# Patient Record
Sex: Female | Born: 1980 | Race: White | Hispanic: No | Smoking: Former smoker
Health system: Southern US, Community
[De-identification: ages and names within clinical notes are randomized; demographics above are authoritative.]

## PROBLEM LIST (undated history)

## (undated) DIAGNOSIS — M1990 Unspecified osteoarthritis, unspecified site: Secondary | ICD-10-CM

## (undated) DIAGNOSIS — Z789 Other specified health status: Secondary | ICD-10-CM

---
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
## 2000-01-13 ENCOUNTER — Inpatient Hospital Stay (HOSPITAL_COMMUNITY): Admission: AD | Admit: 2000-01-13 | Discharge: 2000-01-13 | Payer: Self-pay | Admitting: Obstetrics & Gynecology

## 2000-01-13 ENCOUNTER — Inpatient Hospital Stay (HOSPITAL_COMMUNITY): Admission: AD | Admit: 2000-01-13 | Discharge: 2000-01-17 | Payer: Self-pay | Admitting: Obstetrics and Gynecology

## 2000-02-14 ENCOUNTER — Other Ambulatory Visit: Admission: RE | Admit: 2000-02-14 | Discharge: 2000-02-14 | Payer: Self-pay | Admitting: Obstetrics and Gynecology

## 2000-03-06 ENCOUNTER — Emergency Department (HOSPITAL_COMMUNITY): Admission: EM | Admit: 2000-03-06 | Discharge: 2000-03-06 | Payer: Self-pay | Admitting: Emergency Medicine

## 2000-04-21 ENCOUNTER — Emergency Department (HOSPITAL_COMMUNITY): Admission: EM | Admit: 2000-04-21 | Discharge: 2000-04-21 | Payer: Self-pay | Admitting: Emergency Medicine

## 2000-05-12 ENCOUNTER — Encounter (INDEPENDENT_AMBULATORY_CARE_PROVIDER_SITE_OTHER): Payer: Self-pay | Admitting: Specialist

## 2000-05-12 ENCOUNTER — Ambulatory Visit (HOSPITAL_COMMUNITY): Admission: RE | Admit: 2000-05-12 | Discharge: 2000-05-12 | Payer: Self-pay | Admitting: Obstetrics and Gynecology

## 2000-08-26 ENCOUNTER — Other Ambulatory Visit: Admission: RE | Admit: 2000-08-26 | Discharge: 2000-08-26 | Payer: Self-pay | Admitting: Obstetrics and Gynecology

## 2001-04-23 ENCOUNTER — Other Ambulatory Visit: Admission: RE | Admit: 2001-04-23 | Discharge: 2001-04-23 | Payer: Self-pay | Admitting: Obstetrics and Gynecology

## 2001-08-09 ENCOUNTER — Other Ambulatory Visit: Admission: RE | Admit: 2001-08-09 | Discharge: 2001-08-09 | Payer: Self-pay | Admitting: Obstetrics and Gynecology

## 2003-06-14 ENCOUNTER — Emergency Department (HOSPITAL_COMMUNITY): Admission: EM | Admit: 2003-06-14 | Discharge: 2003-06-14 | Payer: Self-pay | Admitting: Emergency Medicine

## 2003-07-05 ENCOUNTER — Emergency Department (HOSPITAL_COMMUNITY): Admission: EM | Admit: 2003-07-05 | Discharge: 2003-07-05 | Payer: Self-pay | Admitting: Emergency Medicine

## 2003-10-03 ENCOUNTER — Other Ambulatory Visit: Admission: RE | Admit: 2003-10-03 | Discharge: 2003-10-03 | Payer: Self-pay | Admitting: Obstetrics and Gynecology

## 2004-03-09 ENCOUNTER — Inpatient Hospital Stay (HOSPITAL_COMMUNITY): Admission: AD | Admit: 2004-03-09 | Discharge: 2004-03-09 | Payer: Self-pay | Admitting: Obstetrics and Gynecology

## 2004-03-15 ENCOUNTER — Encounter (INDEPENDENT_AMBULATORY_CARE_PROVIDER_SITE_OTHER): Payer: Self-pay | Admitting: Specialist

## 2004-03-15 ENCOUNTER — Inpatient Hospital Stay (HOSPITAL_COMMUNITY): Admission: RE | Admit: 2004-03-15 | Discharge: 2004-03-18 | Payer: Self-pay | Admitting: Obstetrics and Gynecology

## 2004-05-26 ENCOUNTER — Emergency Department (HOSPITAL_COMMUNITY): Admission: EM | Admit: 2004-05-26 | Discharge: 2004-05-26 | Payer: Self-pay | Admitting: *Deleted

## 2004-06-01 ENCOUNTER — Emergency Department (HOSPITAL_COMMUNITY): Admission: EM | Admit: 2004-06-01 | Discharge: 2004-06-01 | Payer: Self-pay | Admitting: Emergency Medicine

## 2005-07-23 ENCOUNTER — Emergency Department (HOSPITAL_COMMUNITY): Admission: EM | Admit: 2005-07-23 | Discharge: 2005-07-23 | Payer: Self-pay | Admitting: Emergency Medicine

## 2005-07-25 ENCOUNTER — Emergency Department (HOSPITAL_COMMUNITY): Admission: EM | Admit: 2005-07-25 | Discharge: 2005-07-25 | Payer: Self-pay | Admitting: Family Medicine

## 2006-01-28 ENCOUNTER — Emergency Department (HOSPITAL_COMMUNITY): Admission: EM | Admit: 2006-01-28 | Discharge: 2006-01-28 | Payer: Self-pay | Admitting: Emergency Medicine

## 2006-08-15 ENCOUNTER — Emergency Department: Payer: Self-pay | Admitting: Emergency Medicine

## 2006-09-05 ENCOUNTER — Emergency Department (HOSPITAL_COMMUNITY): Admission: EM | Admit: 2006-09-05 | Discharge: 2006-09-05 | Payer: Self-pay | Admitting: Family Medicine

## 2007-04-08 ENCOUNTER — Ambulatory Visit (HOSPITAL_COMMUNITY): Admission: RE | Admit: 2007-04-08 | Discharge: 2007-04-08 | Payer: Self-pay | Admitting: Obstetrics and Gynecology

## 2007-05-06 ENCOUNTER — Ambulatory Visit (HOSPITAL_COMMUNITY): Admission: RE | Admit: 2007-05-06 | Discharge: 2007-05-06 | Payer: Self-pay | Admitting: Obstetrics and Gynecology

## 2007-07-16 ENCOUNTER — Ambulatory Visit (HOSPITAL_COMMUNITY): Admission: RE | Admit: 2007-07-16 | Discharge: 2007-07-16 | Payer: Self-pay | Admitting: Obstetrics and Gynecology

## 2008-05-15 ENCOUNTER — Emergency Department (HOSPITAL_COMMUNITY): Admission: EM | Admit: 2008-05-15 | Discharge: 2008-05-15 | Payer: Self-pay | Admitting: Family Medicine

## 2009-02-27 ENCOUNTER — Emergency Department (HOSPITAL_COMMUNITY): Admission: EM | Admit: 2009-02-27 | Discharge: 2009-02-27 | Payer: Self-pay | Admitting: Emergency Medicine

## 2009-06-27 ENCOUNTER — Emergency Department (HOSPITAL_COMMUNITY): Admission: EM | Admit: 2009-06-27 | Discharge: 2009-06-27 | Payer: Self-pay | Admitting: Emergency Medicine

## 2010-05-03 ENCOUNTER — Emergency Department (HOSPITAL_COMMUNITY): Admission: EM | Admit: 2010-05-03 | Discharge: 2010-05-03 | Payer: Self-pay | Admitting: Emergency Medicine

## 2010-12-30 LAB — URINALYSIS, ROUTINE W REFLEX MICROSCOPIC
Glucose, UA: NEGATIVE mg/dL
Protein, ur: NEGATIVE mg/dL
Urobilinogen, UA: 1 mg/dL (ref 0.0–1.0)

## 2010-12-30 LAB — URINE MICROSCOPIC-ADD ON

## 2010-12-30 LAB — PREGNANCY, URINE: Preg Test, Ur: NEGATIVE

## 2010-12-30 LAB — WET PREP, GENITAL: Trich, Wet Prep: NONE SEEN

## 2011-02-07 NOTE — Discharge Summary (Signed)
[facility]  Patient:    Tiffany Weaver, Tiffany Weaver Visit Number: [id number] MRN: [id number]          Service Type: OBS Location: 910A 9103 [id number] Attending Physician:  Melony Overly Dictated by:   Leilani Able, P.A. Admit Date:  01/13/2000 Discharge Date: 01/17/2000                             Discharge Summary  FINAL DIAGNOSES:              1. Intrauterine pregnancy at term.                               2. Arrest of second stage of labor.  PROCEDURE:                    Primary low transverse cesarean section.  SURGEON:                      Miguel Aschoff, M.D.  COMPLICATIONS:                None.  HISTORY OF PRESENT ILLNESS:   This 30 year old, gravida 1, para 0, was admitted at 41+ weeks in early labor.  HOSPITAL COURSE:              The patient progressed to complete and complete after Pitocin augmentation.  The patient had excellent pushng efforts and an adequate  labor pattern, but was unable to cause the descent of the fetal presenting part to effect vaginal delivery.  The head appeared to be occipitoposterior and at this  point, it was discussed with the patient whether to proceed with a cesarean section.  The patient agreed.  The patient was taken to the operating room by Miguel Aschoff, M.D. where a primary low transverse cesarean section was performed with the delivery of an 8 pound 5 ounce female infant with Apgars of 9 and 9.  The delivery went without complications.  The patients postoperative course was benign without significant fever.  She was felt ready for discharge on postoperative day #3, was sent home on a regular diet, told to decrease activities, told to continue prenatal vitamins, and was given Percocet one to two every four hours as needed for pain. She was also given a prescription for Motrin 600 mg one every six hours as needed for pain.  She was told to follow up in the office in four weeks. Dictated by:    Leilani Able, P.A. Attending Physician:  Melony Overly DD:  02/05/00 TD:  02/05/00 Job: [id number]

## 2011-02-07 NOTE — Op Note (Signed)
[facility]  Patient:    Tiffany Weaver, Tiffany Weaver              MRN: [id number] Proc. Date: 01/14/00 Adm. Date:  [id number] Attending:  Conley Simmonds A                           Operative Report  PREOPERATIVE DIAGNOSES:       Intrauterine pregnancy at term; arrest of the second stage of labor.  POSTOPERATIVE DIAGNOSES:      Intrauterine pregnancy at term; arrest of the second stage of labor; delivery of viable female infant, Apgar 8/9, occipitoposterior position.  PROCEDURE:                    Primary low-flap transverse cesarean section.  SURGEON:                      Miguel Aschoff, M.D.  ANESTHESIA:                   Epidural.  COMPLICATIONS:                None.  JUSTIFICATION:                The patient is an 30 year old white female, gravida 1, para 0, at 41+ weeks, admitted earlier in labor.  The patient progressed to complete dilatation after Pitocin augmentation and with excellent pushing efforts and adequate labor, was unable to cause descent of the fetal presenting part to  effect vaginal delivery.  On examination, the head appeared to be occipitoposterior with a large amount of caput and molding and in view of the high station and three hours of arrested second stage of labor, she is being taken now to the operating room to undergo primary cesarean section.  DESCRIPTION OF PROCEDURE:     Patient was taken to the operating room, placed in a supine position, her previously placed epidural anesthetic was injected and an adequate level of anesthesia was achieved without difficulty.  Following this,  Foley catheter was inserted.  A Pfannenstiel incision was made and extended down through the subcutaneous tissue, with bleeding points being clamped and coagulated as they were encountered.  The fascia was then identified and incised transversely. The fascia was separated from the underlying rectus muscles.  Rectus  muscles were identified in the midline.  The peritoneum was then entered, carefully avoiding  underlying structures.  Peritoneal incision was then extended under direct visualization.  Bladder flap was then created and protected with the bladder blade and then an elliptical transverse incision was made into the lower uterine segment. The amniotic cavity was entered; clear fluid was obtained, although previously reported as meconium stained.  The baby was delivered from an ROP position, again clearing the babys head, and again, marked caput and molding were noted.  The nose and mouth were suctioned with DeLee suction and the baby was delivered and handed to the pediatric team in attendance.  Apgar score at one minute was 8 and at five minutes was 9.  Cord bloods were obtained for appropriate studies, then the placenta was delivered.  The uterus was evacuated of any remaining products of conception, then the uterus was closed in layers; the first layer was a running-interlocking suture of #1 Vicryl, followed by an imbricating suture of #1 Vicryl.  There was a small amount of bleeding noted in the midportion of the  incision; this was brought under control by two figure-of-eight sutures of #1 Vicryl.  The bladder flap was then reapproximated using running continuous 2-0 Vicryl suture.  After this was done, lap counts were taken and found to be correct, then the abdomen was closed.  The parietal peritoneum was closed using  running continuous 0 Vicryl suture.  Rectus muscles were reapproximated using a  running continuous 0 Vicryl suture.  The fascia was closed using two sutures of  0 Vicryl, each starting at the lateral fascial angle and meeting in the midline. Subcutaneous tissue and skin were closed using staples.  The estimated blood loss was approximately 600 cc.  Patient tolerated the procedure well and went to the  recovery room in satisfactory condition.  The patient  tolerated the procedure well.DD:  01/14/00 TD:  01/15/00 Job: [id number]

## 2011-02-07 NOTE — Op Note (Signed)
[facility]  Patient:    Tiffany Weaver, Tiffany Weaver              MRN: [id number] Proc. Date: 05/13/99 Adm. Date:  [id number] Attending:  Miguel Aschoff                           Operative Report  PREOPERATIVE DIAGNOSIS:       Cervical intraepithelial neoplasia with                               unsatisfactory colposcopy.  POSTOPERATIVE DIAGNOSIS:      Cervical intraepithelial neoplasia with                               unsatisfactory colposcopy.  OPERATION:                    Cone biopsy and endocervical curettage.  SURGEON:                      Miguel Aschoff, M.D.  ASSISTANT:  ANESTHESIA:                   General anesthesia.  COMPLICATIONS:                None.  INDICATIONS:                  The patient is a 30 year old white female with cervical dysplasia who underwent colposcopy which revealed a lesion extending into the endocervical canal.  The upper extent of this lesion could not be visualized because of this finding.  She presents now to undergo cone biopsy as both diagnostic and therapeutic procedure.  Risks and benefits of this procedure were discussed with the patient and informed consent has been obtained.  DESCRIPTION OF PROCEDURE:     The patient was taken to the operating room and placed in the supine position and general anesthesia was administered without difficulty.  She was then placed in the dorsal lithotomy position, prepped and draped in the usual sterile fashion. The bladder was catheterized.  After this was done, speculum was placed in the vaginal vault. The anterior cervical lip was grasped with a tenaculum.  The figure-of-eight sutures of 0 chromic were placed between the 2 and 4 oclock positions and the 8 and 10 oclock positions on the lateral cervix to include descending branches of the cervical artery. These were tied and then held for traction.  The cervix was then stained with Lugol solution.  There was diffuse uptake  of Lugol solution on the exocervix.  No gross lesions were visualized.  At this point, dilute Pitressin solution was injected into the cervix and 15 cc of this were used with 20 units being placed in 20 cc of normal saline and again 15 cc were injected into the cervix at the 12, 4 and 8 oclock positions.  Then using a knife, a cone of tissue was cut circumferentially beginning at the 3 oclock position and continuing clockwise until the cone was created, then it was dissected to the endocervix and excised in two pieces, an anterior and posterior piece which were sent as separate specimens.  The residual portion of the endocervical canal was then curetted and this was sent also as a separate specimen. Then the bed of the  cone biopsy site was cauterized with electrocautery with excellent hemostasis achieved.  A Gelfoam pack was placed into this area and held in place by tying with previously placed chromic sutures across the cervix to hold in the Gelfoam.  At this point the procedure was completed.  The estimated blood loss was less than 10 cc.  The patient tolerated the procedure well.  PLAN:                         The plan is for the patient to be discharged to home.  She will be seen back in four weeks for follow-up examination. She is to call for pathology report in one week.  She is to call if there are any problems such as fever, pain, or heavy bleedings.  HOME MEDICATIONS:             Doxycycline 100 mg twice a day for three days. and Darvocet-N 100. DD:  05/12/00 TD:  05/13/00 Job: [id number]

## 2011-02-07 NOTE — H&P (Signed)
NAME:  Tiffany Weaver, Tiffany Weaver                 ACCOUNT NO.:  [id number]   MEDICAL RECORD NO.:  [id number]                   PATIENT TYPE:  INP   LOCATION:  NA                                   [facility]  [facility]   PHYSICIAN:  Malachi Pro. Ambrose Mantle, M.D.              DATE OF BIRTH:  [birth year]   DATE OF ADMISSION:  03/15/2004  DATE OF DISCHARGE:                                HISTORY & PHYSICAL   PRESENT ILLNESS:  This is a 30 year old white female, para 1-0-0-1, gravida  2, with last menstrual period June 12, 2003, [facility] March 15, 2004 by dates  and March 21, 2004 by ultrasound, admitted for repeat C-section after  declining a vaginal birth after cesarean.  Blood group and type O-positive,  negative antibody, nonreactive serology, rubella immune, hepatitis B surface  antigen negative, HIV negative, GC and Chlamydia negative, 1-hour Glucola  107, group B strep negative.  Vaginal ultrasound on August 30, 2003:  [place]-  rump length 3.95 cm, 10 weeks 6 days, [facility] March 21, 2004.  Repeat ultrasound  on October 25, 2003:  [facility] March 21, 2004.  Patient had a low transverse  cervical C-section in the past and was compatible for trial of labor but did  not choose to have that.  She had a relatively uncomplicated prenatal  course.  She did have a bronchitis on March 13, 2004 and was placed on  Augmentin.   ALLERGIES:  Her past medical history reveals no known allergies.   PAST SURGICAL HISTORY:  1. She did have a conization in 2001.  2. She also had a C-section in 2001.  3. She had 2 surgeries on her left forearm in 2004 after falling through a     glass door.   PAST MEDICAL HISTORY:  1. She has had a history of kidney infections.  2. History of depression.   SOCIAL HISTORY:  She does smoke about a half a pack a day.   OBSTETRICAL HISTORY:  In April of 2001, she delivered a 8-pound 5-ounce female  infant by C-section after having pushed for 3 hours without being able to  deliver.   FAMILY HISTORY:   Father and mother had depression and bipolar.   PHYSICAL EXAM:  GENERAL:  Well-developed, well-nourished white female in no  acute distress.  VITAL SIGNS:  Blood pressure 128/80, pulse of 80, weight 151-3/4 pounds.  HEENT:  Normal.  HEART:  Heart normal size and sounds.  No murmurs.  LUNGS:  Lungs clear to P&A.  ABDOMEN:  Abdomen soft.  Fundi height 38 cm on March 13, 2004.  Fetal heart  tones normal.  PELVIC:  Cervix dimple-dilated, 70% to 80% effaced, vertex at a minus 2.   ADMITTING IMPRESSION:  Intrauterine pregnancy at 39 weeks, prior cesarean  section, declines vaginal birth after cesarean section.  The patient is  prepared for cesarean section.  She requests and is ready to proceed.  Malachi Pro. Ambrose Mantle, M.D.    TFH/MEDQ  D:  03/14/2004  T:  03/15/2004  Job:  [id number]

## 2011-02-07 NOTE — Discharge Summary (Signed)
NAME:  [name], Tiffany Weaver                 ACCOUNT NO.:  [id number]   MEDICAL RECORD NO.:  [id number]                   PATIENT TYPE:  INP   LOCATION:  9144                                 [facility]   PHYSICIAN:  Malachi Pro. Ambrose Mantle, M.D.              DATE OF BIRTH:  [birth year]   DATE OF ADMISSION:  03/15/2004  DATE OF DISCHARGE:  03/18/2004                                 DISCHARGE SUMMARY   HOSPITAL COURSE:  This is a 30 year old white female admitted for repeat C-  section after declining vaginal birth after cesarean.  Her due date was March 21, 2004.  She underwent a low transverse cervical cesarean section by Dr.  Ambrose Mantle with Dr. Jackelyn Knife assisting under spinal anesthesia.  Delivery of a  female infant 8 pounds 13 ounces with Apgars of 8 at one and 8 at five  minutes.  Postpartum, the patient did extremely well.  Was discharged on he  third postop day.  Her prenatal course is detailed in her Admission History  and Physical.  Initial hemoglobin 12.8, hematocrit 38.1, white count 10,900,  platelet count 260,000.  Follow up hemoglobin 10.7.  Comprehensive metabolic  profile was normal except for a glucose of 115, BUN of 2, total protein of  5.2, albumin of 2.4.  Urinalysis was negative.  The patient was O positive  with a negative antibody screen.  The patient's staples were removed on the  third postop day.  Strips were applied and she was discharged.   FINAL DIAGNOSES:  Intrauterine pregnancy at 39 weeks delivered vertex by a  repeat cesarean section, declined vaginal birth after cesarean.   OPERATION:  Low transverse cervical C-section.   FINAL CONDITION:  Improved.   INSTRUCTIONS:  Include our regular discharge instruction booklet.  Percocet  5/325 mg, dispense #24 tablets, one every 4-6 hours as needed for pain and  the patient is advised to return to the office in 10 days for followup  examination.                                               Malachi Pro. Ambrose Mantle,  M.D.    TFH/MEDQ  D:  03/18/2004  T:  03/18/2004  Job:  [id number]

## 2011-02-07 NOTE — Op Note (Signed)
NAME:  Tiffany Weaver, Tiffany Weaver                 ACCOUNT NO.:  [id number]   MEDICAL RECORD NO.:  [id number]                   PATIENT TYPE:  INP   LOCATION:  9144                                 [facility]   PHYSICIAN:  Malachi Pro. Ambrose Mantle, M.D.              DATE OF BIRTH:  [birth year]   DATE OF PROCEDURE:  03/15/2004  DATE OF DISCHARGE:                                 OPERATIVE REPORT   PREOPERATIVE DIAGNOSES:  1. Intrauterine pregnancy at 39 weeks.  2. Prior cesarean section.  3. Declined vaginal birth after cesarean section.   POSTOPERATIVE DIAGNOSES:  1. Intrauterine pregnancy at 39 weeks.  2. Prior cesarean section.  3. Declined vaginal birth after cesarean section.   OPERATION:  Low transverse cervical cesarean section.   SURGEON:  Malachi Pro. Ambrose Mantle, M.D.   ASSISTANT:  Zenaida Niece, M.D.   ANESTHESIA:  Spinal anesthesia.   DESCRIPTION OF PROCEDURE:  The patient was brought to the operating room and  given a spinal anesthetic by Dr. Jean Rosenthal.  He had to make two entry  locations.  The first location would not permit entry into the spinal canal.  After he gave spinal anesthesia, the patient was placed in the left lateral  tilt position.  Fetal heart tones had been recorded as normal.  The abdomen  was prepped with Betadine solution.  The urethra was prepped.  The Foley  catheter was inserted to straight drain.  The patient was placed with her  legs and body supine in the left lateral tilt position.  The abdomen was  draped as a sterile field.  Anesthesia was confirmed and the right side of  the old incision was utilized but I made the incision through the skin above  the left side of the incision because it was somewhat asymmetrical.  I  carried this incision through the skin, subcutaneous tissue and fascia.  The  fascia was then separated from the rectus muscles superiorly and inferiorly.  The rectus muscle was slightly separated in the midline.  I created a window  into the peritoneal cavity, enlarged the incision, exposed the lower uterine  segment, incised the lower uterine segment peritoneum, pushed the bladder  inferiorly, made an incision into the lower uterine segment myometrium, and  then with my finger, was able to enter the amniotic sac.  Clear fluid was  obtained.  I enlarged the incision by pulling inferiorly and superiorly.  The infant's vertex was directly OP and the cord was presenting right in  front of the face.  I was able to rotate the baby to an OA position and then  deliver the vertex through the incisional opening.  The nose and pharynx was  suctioned with the bulb.  The remainder of the baby was delivered.  Cord was  clamped.  The infant was given to Dr. Judie Petit, who was in attendance.  He is an 8  pound 13 ounce female infant, Apgars of 8 at one  and 8 at five minutes.  The  placenta was removed intact.  The uterus was inspected and found to be free  of debris.  The cervix was dilated with a ring forceps, and then the uterine  incision was closed with two layers of 0 Vicryl, closing the first layer  with a running locked suture, the second layer with an imbricating suture  nonlocking.  Hemostasis appeared adequate except for a few spots that were  Bovied and then one suture of 3-0 Vicryl was used for hemostasis.  Liberal  irrigation confirmed hemostasis.  Both tubes and ovaries and the uterus  appeared normal.  The abdominal wall was then closed by reapproximating the  rectus muscle with 0 Vicryl closing the fascia with two  running sutures of 0 Vicryl, the subcutaneous with a running 3-0 Vicryl and  the staples were used for the skin.  The patient seemed to tolerate the  procedure well.  Estimated blood loss was 1000 mL.  Sponge and needle counts  were correct.  She was returned to the recovery room in satisfactory  condition.                                               Malachi Pro. Ambrose Mantle, M.D.    TFH/MEDQ  D:  03/15/2004  T:   03/15/2004  Job:  [id number]

## 2011-02-19 ENCOUNTER — Other Ambulatory Visit (HOSPITAL_COMMUNITY)
Admission: RE | Admit: 2011-02-19 | Discharge: 2011-02-19 | Disposition: A | Discharge status: Home / Self Care | Payer: BC Managed Care – PPO | Source: Ambulatory Visit | Attending: Obstetrics and Gynecology | Admitting: Obstetrics and Gynecology

## 2011-02-19 ENCOUNTER — Other Ambulatory Visit: Payer: Self-pay

## 2011-02-19 DIAGNOSIS — Z113 Encounter for screening for infections with a predominantly sexual mode of transmission: Secondary | ICD-10-CM | POA: Insufficient documentation

## 2011-02-19 DIAGNOSIS — Z01419 Encounter for gynecological examination (general) (routine) without abnormal findings: Secondary | ICD-10-CM | POA: Insufficient documentation

## 2011-02-19 DIAGNOSIS — N760 Acute vaginitis: Secondary | ICD-10-CM | POA: Insufficient documentation

## 2011-02-20 ENCOUNTER — Other Ambulatory Visit: Payer: Self-pay

## 2011-10-28 ENCOUNTER — Emergency Department (HOSPITAL_COMMUNITY): Payer: BC Managed Care – PPO

## 2011-10-28 ENCOUNTER — Emergency Department (HOSPITAL_COMMUNITY)
Admission: EM | Admit: 2011-10-28 | Discharge: 2011-10-28 | Disposition: A | Discharge status: Home / Self Care | Payer: BC Managed Care – PPO | Attending: Emergency Medicine | Admitting: Emergency Medicine

## 2011-10-28 ENCOUNTER — Encounter (HOSPITAL_COMMUNITY): Payer: Self-pay | Admitting: Emergency Medicine

## 2011-10-28 DIAGNOSIS — S61409A Unspecified open wound of unspecified hand, initial encounter: Secondary | ICD-10-CM | POA: Insufficient documentation

## 2011-10-28 DIAGNOSIS — S60229A Contusion of unspecified hand, initial encounter: Secondary | ICD-10-CM | POA: Insufficient documentation

## 2011-10-28 DIAGNOSIS — S61419A Laceration without foreign body of unspecified hand, initial encounter: Secondary | ICD-10-CM

## 2011-10-28 DIAGNOSIS — F17200 Nicotine dependence, unspecified, uncomplicated: Secondary | ICD-10-CM | POA: Insufficient documentation

## 2011-10-28 DIAGNOSIS — W268XXA Contact with other sharp object(s), not elsewhere classified, initial encounter: Secondary | ICD-10-CM | POA: Insufficient documentation

## 2011-10-28 MED ORDER — TETANUS-DIPHTH-ACELL PERTUSSIS 5-2.5-18.5 LF-MCG/0.5 IM SUSP
0.5000 mL | Freq: Once | INTRAMUSCULAR | Status: AC
  Administered 2011-10-28: 0.5 mL via INTRAMUSCULAR
  Filled 2011-10-28: qty 0.5

## 2011-10-28 MED ORDER — BACITRACIN ZINC 500 UNIT/GM EX OINT
TOPICAL_OINTMENT | CUTANEOUS | Status: AC
  Administered 2011-10-28: 1
  Filled 2011-10-28: qty 0.9

## 2011-10-28 MED ORDER — IBUPROFEN 800 MG PO TABS: 800.0000 mg | ORAL_TABLET | Freq: Three times a day (TID) | ORAL | Status: AC | PRN

## 2011-10-28 NOTE — ED Provider Notes (Addendum)
Medical screening examination/treatment/procedure(s) were conducted as a shared visit with non-physician practitioner(s) and myself.  I personally evaluated the patient during the encounter 31 year old, right-hand dominant female.  Says she was trying to get into to her house through a locked window.  She looked in the window broke and landed on the dorsum of her right hand.  She has a small laceration approximately half a centimeter of the dorsolateral aspect of her hand.  She states that she is not able to extend her long finger.  On examination.  There is the quadrants.  Laceration, with mild ecchymoses over the third MCP joint.  There is moderate tenderness.  She says that she is not able to extend her finger completely.  Because of pain.  However, when it is flexed approximately 90 she has a able to extend her long finger against force.  The laceration is very superficial and small.  I do not think he needs closure.  We will consult with the hand surgeon concerning the possibility of a tendon injury.  We will splint her digit and I believe outpatient.  Followup is adequate.   Nicholes Stairs, MD 10/28/11 2018  I spoke with Dr. Lajoyce Corners. He agreed with plan. He will see her in office.  Nicholes Stairs, MD 10/28/11 2134

## 2011-10-28 NOTE — ED Provider Notes (Signed)
Medical screening examination/treatment/procedure(s) were conducted as a shared visit with non-physician practitioner(s) and myself.  I personally evaluated the patient during the encounter  Nicholes Stairs, MD 10/28/11 [phone]

## 2011-10-28 NOTE — ED Notes (Signed)
Ortho tech here to apply the finger splint

## 2011-10-28 NOTE — ED Provider Notes (Signed)
History     CSN: [id number]  Arrival date & time 10/28/11  1308   First MD Initiated Contact with Patient 10/28/11 1926      Chief Complaint  Patient presents with  . Extremity Laceration    (Consider location/radiation/quality/duration/timing/severity/associated sxs/prior treatment) HPI Comments: The patient reports she was locked out of her house and was attempting to enter through a window when the window broke cutting her right hand.  States she immediately loss the ability to extend her third finger.  States she has two lacerations, one on her dorsal hand and one over the palmar surface at the base of her thumb.  Denies ability to flex fingers, denies decreased sensation, denies other injury.  Does not know when her last tetanus vx was.    The history is provided by the patient.    History reviewed. No pertinent past medical history.  Past Surgical History  Procedure Date  . Cesarean section     Family History  Problem Relation Age of Onset  . Diabetes Mother     History  Substance Use Topics  . Smoking status: Current Everyday Smoker -- 0.5 packs/day for 15 years    Types: Cigarettes  . Smokeless tobacco: Never Used  . Alcohol Use: No    OB History    Grav Para Term Preterm Abortions TAB SAB Ect Mult Living                  Review of Systems  All other systems reviewed and are negative.    Allergies  Macrobid  Home Medications   Current Outpatient Rx  Name Route Sig Dispense Refill  . NAPROXEN SODIUM 220 MG PO TABS Oral Take 220 mg by mouth 2 (two) times daily with a meal.      BP 121/77  Pulse 84  Temp(Src) 98.5 F (36.9 C) (Oral)  Resp 18  SpO2 100%  Physical Exam  Nursing note and vitals reviewed. Constitutional: She is oriented to person, place, and time. She appears well-developed and well-nourished.  HENT:  Head: Normocephalic and atraumatic.  Neck: Neck supple.  Pulmonary/Chest: Effort normal.  Musculoskeletal:       Arms:  Hands: Neurological: She is alert and oriented to person, place, and time.    ED Course  Procedures (including critical care time)  Labs Reviewed - No data to display No results found.  7:50 PM Patient seen and examined, discussed with Dr Weldon Inches who will also see the patient.   9:37 PM Per my discussion with Dr Weldon Inches, plan if for d/c home with hand surgeon follow up.  Do not need to close lacerations.  No abx necessary at this time.  Patient declines work note.    1. Laceration of hand with tendon involvement       MDM  Patient with very small lacerations of hand, no evidence of foreign body by exam or xray.  Possible extensor tendon injury.  Dr Weldon Inches discussed with hand surgeon on call who recommended follow up in the office.  Discussed wound care with patient.  Splint applied in ED.  Patient verbalizes understanding and agrees with plan. Rise Patience, [place] 10/28/11 2139

## 2011-10-28 NOTE — ED Notes (Addendum)
Pt states she tried to jimmy open her bathroom window, window pane broke in the process and cut the back of her right hand. Went to [facility] and was referred here by physician. Pt states PMD feels she may have sliced through a tendon. Pt is unable to move right third digit.

## 2012-02-11 IMAGING — CR DG SHOULDER 2+V*L*
3 series · 3 of 3 positions shown · non-contrast
Comparison: None.

CLINICAL DATA: Pain

LEFT SHOULDER - 2+ VIEW

[w shoulder ap internal left]
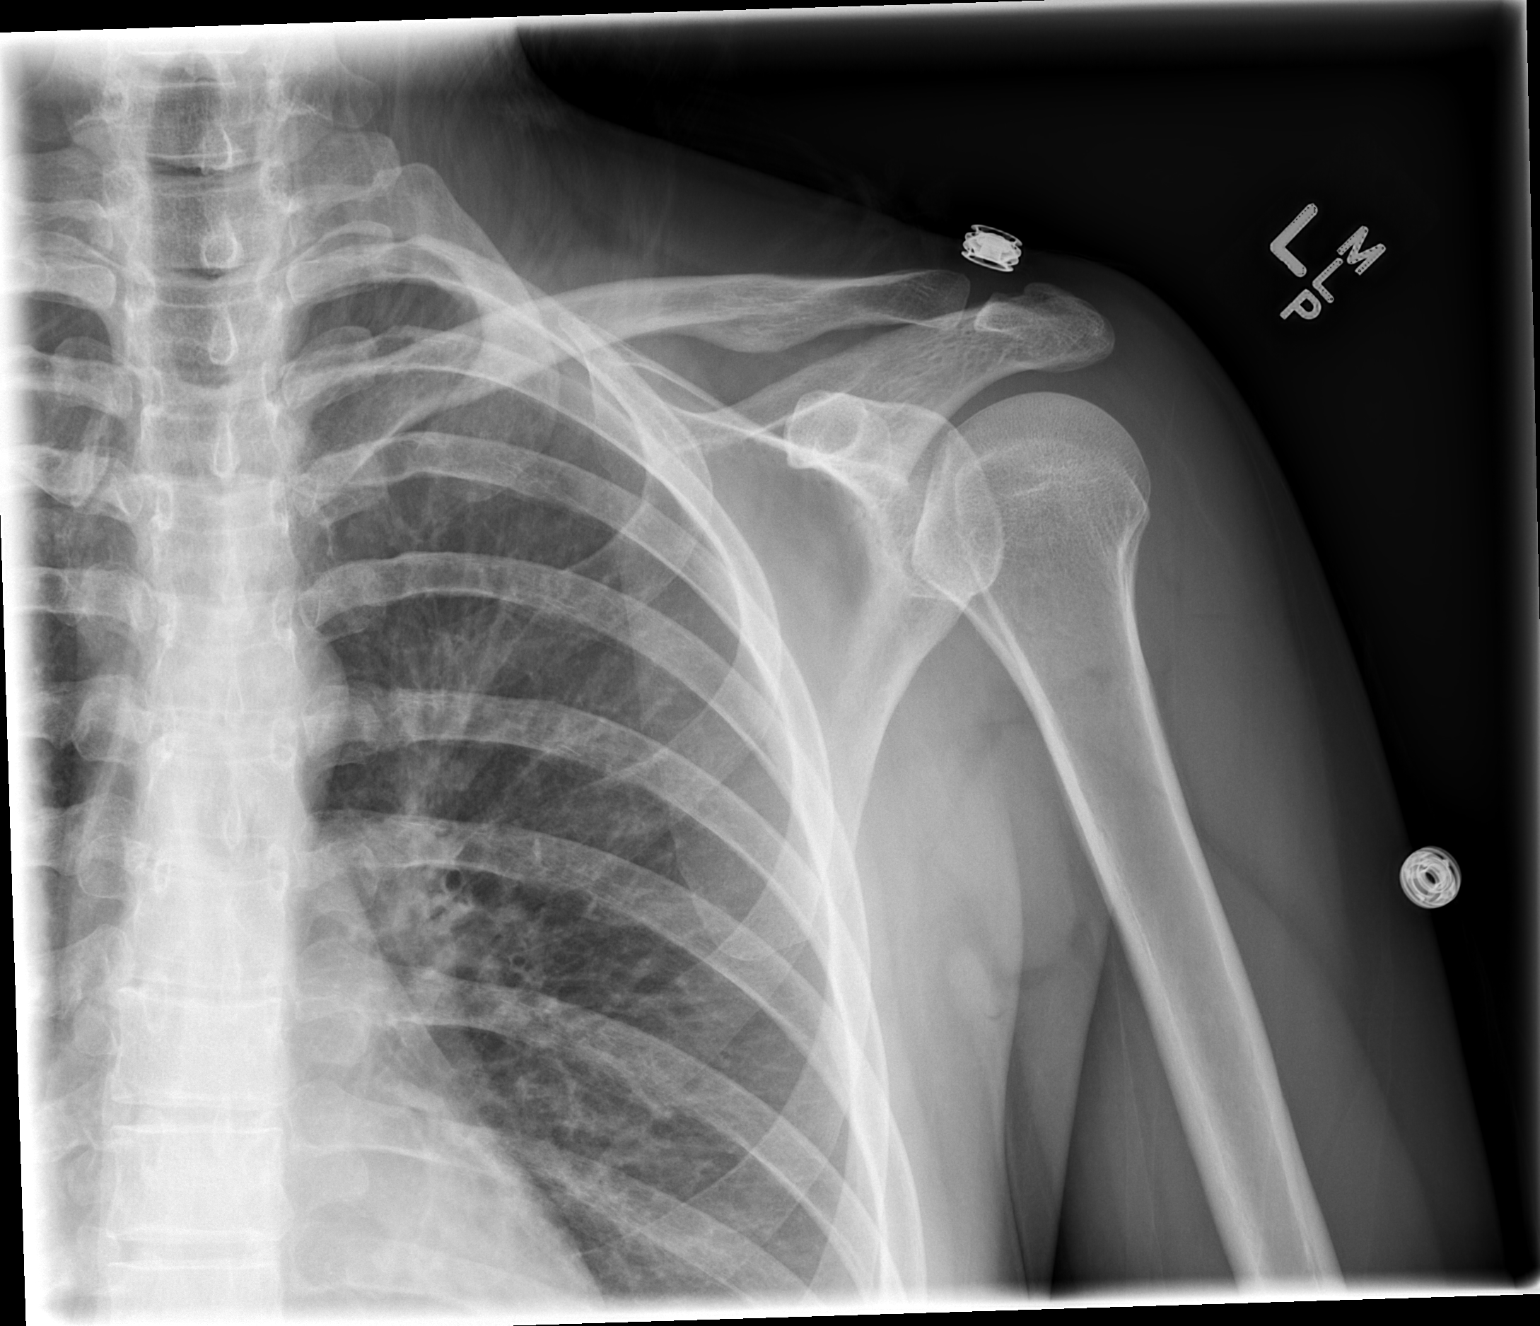

[w shoulder y view left (1 of 2)]
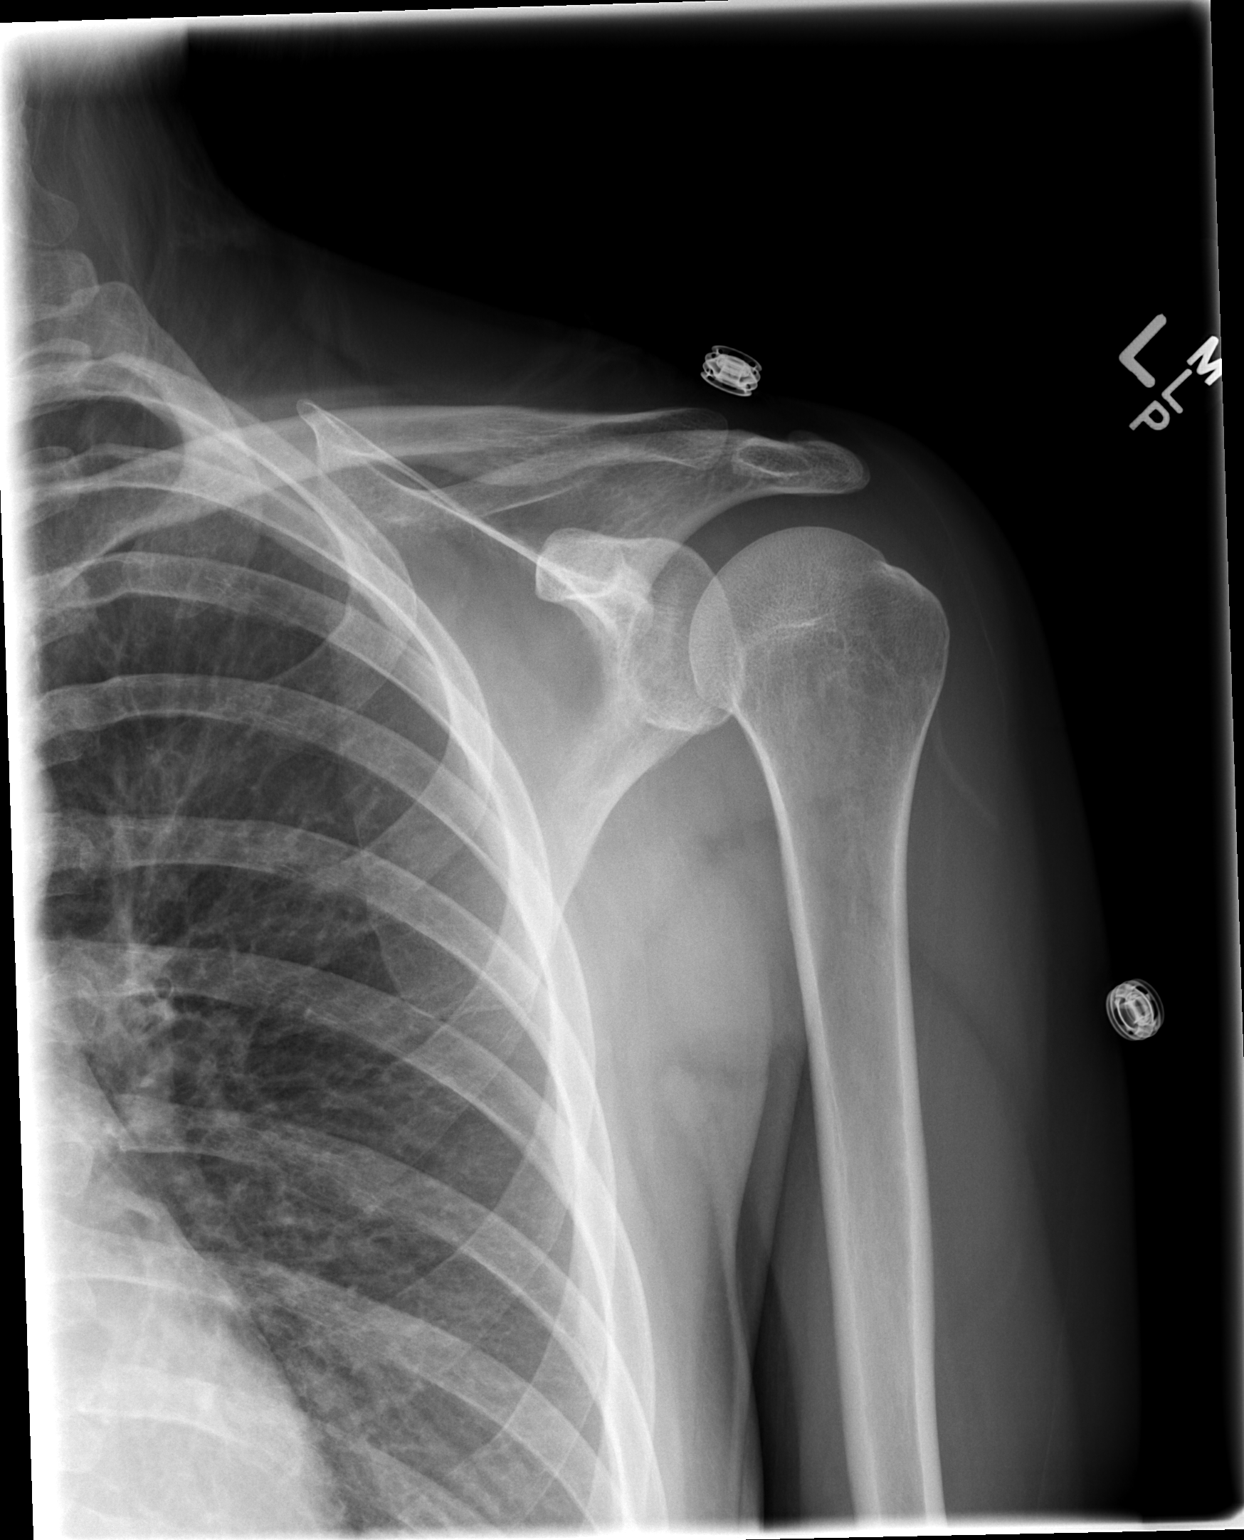

[w shoulder y view left (2 of 2)]
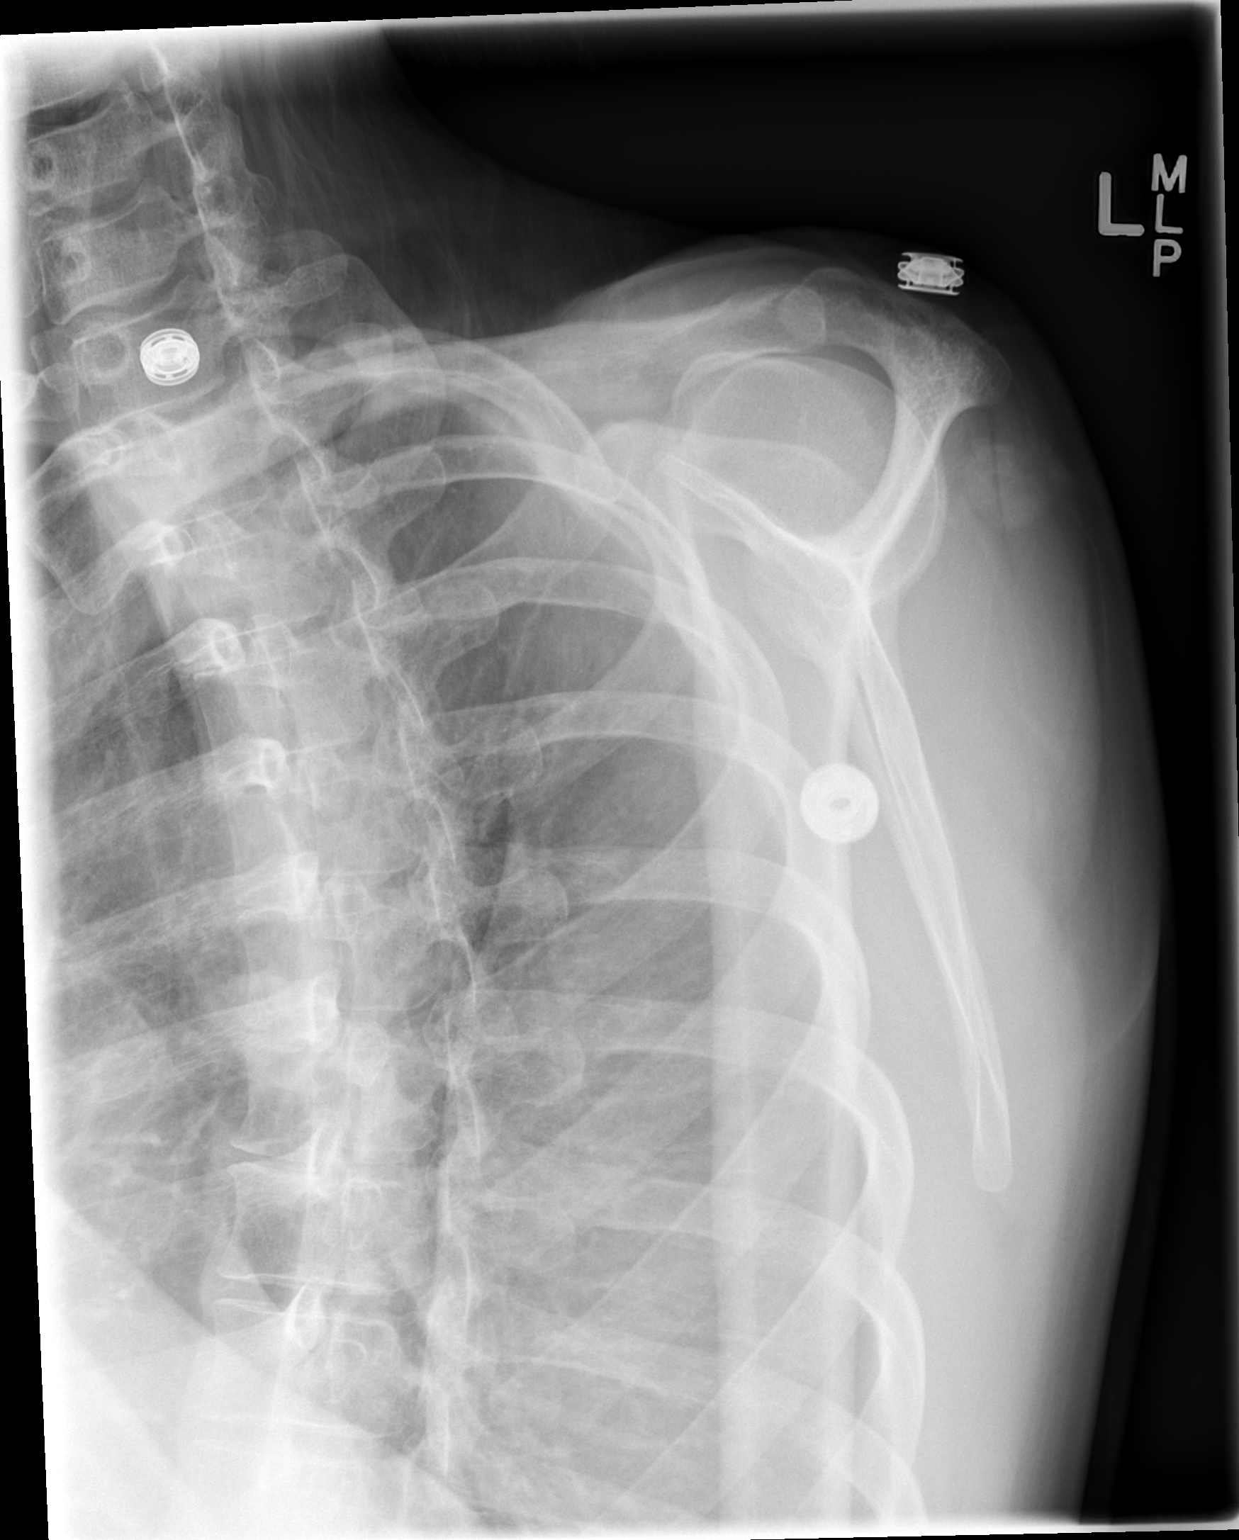

[3 of 3 positions shown; findings below may reference images not displayed]

FINDINGS: Negative for fracture, dislocation, or other acute
abnormality.  Normal alignment and mineralization. No significant
degenerative change.  Regional soft tissues unremarkable.
IMPRESSION: Negative

## 2012-06-15 ENCOUNTER — Other Ambulatory Visit: Payer: Self-pay | Admitting: Obstetrics & Gynecology

## 2012-06-15 ENCOUNTER — Other Ambulatory Visit (HOSPITAL_COMMUNITY)
Admission: RE | Admit: 2012-06-15 | Discharge: 2012-06-15 | Disposition: A | Discharge status: Home / Self Care | Payer: BC Managed Care – PPO | Source: Ambulatory Visit | Attending: Obstetrics & Gynecology | Admitting: Obstetrics & Gynecology

## 2012-06-15 DIAGNOSIS — Z113 Encounter for screening for infections with a predominantly sexual mode of transmission: Secondary | ICD-10-CM | POA: Insufficient documentation

## 2012-06-15 DIAGNOSIS — Z01419 Encounter for gynecological examination (general) (routine) without abnormal findings: Secondary | ICD-10-CM | POA: Insufficient documentation

## 2012-06-15 DIAGNOSIS — Z1151 Encounter for screening for human papillomavirus (HPV): Secondary | ICD-10-CM | POA: Insufficient documentation

## 2012-07-20 ENCOUNTER — Other Ambulatory Visit: Payer: Self-pay | Admitting: Obstetrics and Gynecology

## 2012-12-03 ENCOUNTER — Encounter (HOSPITAL_COMMUNITY): Payer: Self-pay | Admitting: *Deleted

## 2012-12-06 ENCOUNTER — Ambulatory Visit (HOSPITAL_COMMUNITY)
Admission: RE | Admit: 2012-12-06 | Discharge: 2012-12-06 | Disposition: A | Discharge status: Home / Self Care | Payer: BC Managed Care – PPO | Source: Ambulatory Visit | Attending: Obstetrics & Gynecology | Admitting: Obstetrics & Gynecology

## 2012-12-06 ENCOUNTER — Encounter (HOSPITAL_COMMUNITY): Payer: Self-pay | Admitting: Anesthesiology

## 2012-12-06 ENCOUNTER — Encounter (HOSPITAL_COMMUNITY): Payer: Self-pay | Admitting: Pharmacy Technician

## 2012-12-06 ENCOUNTER — Encounter (HOSPITAL_COMMUNITY): Payer: Self-pay | Admitting: *Deleted

## 2012-12-06 ENCOUNTER — Encounter (HOSPITAL_COMMUNITY)
Admission: RE | Disposition: A | Discharge status: Home / Self Care | Payer: Self-pay | Source: Ambulatory Visit | Attending: Obstetrics & Gynecology

## 2012-12-06 ENCOUNTER — Ambulatory Visit (HOSPITAL_COMMUNITY): Payer: BC Managed Care – PPO | Admitting: Anesthesiology

## 2012-12-06 ENCOUNTER — Other Ambulatory Visit: Payer: Self-pay | Admitting: Obstetrics & Gynecology

## 2012-12-06 DIAGNOSIS — Z30433 Encounter for removal and reinsertion of intrauterine contraceptive device: Secondary | ICD-10-CM | POA: Insufficient documentation

## 2012-12-06 HISTORY — PX: HYSTEROSCOPY WITH D & C: SHX1775

## 2012-12-06 HISTORY — DX: Other specified health status: Z78.9

## 2012-12-06 HISTORY — DX: Unspecified osteoarthritis, unspecified site: M19.90

## 2012-12-06 LAB — PREGNANCY, URINE: Preg Test, Ur: NEGATIVE

## 2012-12-06 LAB — CBC
HCT: 39.5 % (ref 36.0–46.0)
MCHC: 33.7 g/dL (ref 30.0–36.0)
Platelets: 185 10*3/uL (ref 150–400)
RDW: 12.2 % (ref 11.5–15.5)

## 2012-12-06 SURGERY — DILATATION AND CURETTAGE /HYSTEROSCOPY
Anesthesia: General | Site: Uterus | Wound class: Clean Contaminated

## 2012-12-06 MED ORDER — LACTATED RINGERS IV SOLN
INTRAVENOUS | Status: DC
  Administered 2012-12-06: 50 mL/h via INTRAVENOUS

## 2012-12-06 MED ORDER — PROPOFOL 10 MG/ML IV EMUL
INTRAVENOUS | Status: DC | PRN
  Administered 2012-12-06: 200 mg via INTRAVENOUS

## 2012-12-06 MED ORDER — FENTANYL CITRATE 0.05 MG/ML IJ SOLN: 25.0000 ug | INTRAMUSCULAR | Status: DC | PRN

## 2012-12-06 MED ORDER — LIDOCAINE HCL (CARDIAC) 20 MG/ML IV SOLN
INTRAVENOUS | Status: AC
  Filled 2012-12-06: qty 5

## 2012-12-06 MED ORDER — FENTANYL CITRATE 0.05 MG/ML IJ SOLN
INTRAMUSCULAR | Status: DC | PRN
  Administered 2012-12-06: 100 ug via INTRAVENOUS

## 2012-12-06 MED ORDER — GLYCINE 1.5 % IR SOLN
Status: DC | PRN
  Administered 2012-12-06: 3000 mL

## 2012-12-06 MED ORDER — MIDAZOLAM HCL 2 MG/2ML IJ SOLN
INTRAMUSCULAR | Status: AC
  Filled 2012-12-06: qty 2

## 2012-12-06 MED ORDER — CEFAZOLIN SODIUM-DEXTROSE 2-3 GM-% IV SOLR
2.0000 g | INTRAVENOUS | Status: AC
  Administered 2012-12-06: 2 g via INTRAVENOUS

## 2012-12-06 MED ORDER — PROPOFOL 10 MG/ML IV EMUL
INTRAVENOUS | Status: AC
  Filled 2012-12-06: qty 20

## 2012-12-06 MED ORDER — KETOROLAC TROMETHAMINE 30 MG/ML IJ SOLN
INTRAMUSCULAR | Status: AC
  Administered 2012-12-06: 30 mg via INTRAVENOUS
  Filled 2012-12-06: qty 1

## 2012-12-06 MED ORDER — ONDANSETRON HCL 4 MG/2ML IJ SOLN
INTRAMUSCULAR | Status: AC
  Filled 2012-12-06: qty 2

## 2012-12-06 MED ORDER — LIDOCAINE HCL (CARDIAC) 20 MG/ML IV SOLN
INTRAVENOUS | Status: DC | PRN
  Administered 2012-12-06: 80 mg via INTRAVENOUS

## 2012-12-06 MED ORDER — MIDAZOLAM HCL 5 MG/5ML IJ SOLN
INTRAMUSCULAR | Status: DC | PRN
  Administered 2012-12-06: 2 mg via INTRAVENOUS

## 2012-12-06 MED ORDER — ONDANSETRON HCL 4 MG/2ML IJ SOLN
INTRAMUSCULAR | Status: DC | PRN
  Administered 2012-12-06: 4 mg via INTRAVENOUS

## 2012-12-06 MED ORDER — FENTANYL CITRATE 0.05 MG/ML IJ SOLN
INTRAMUSCULAR | Status: AC
  Filled 2012-12-06: qty 2

## 2012-12-06 SURGICAL SUPPLY — 12 items
CATH ROBINSON RED A/P 16FR (CATHETERS) ×2 IMPLANT
CLOTH BEACON ORANGE TIMEOUT ST (SAFETY) ×2 IMPLANT
DRESSING TELFA 8X3 (GAUZE/BANDAGES/DRESSINGS) ×2 IMPLANT
GLOVE BIO SURGEON STRL SZ7.5 (GLOVE) ×2 IMPLANT
GLOVE BIOGEL PI IND STRL 8 (GLOVE) ×1 IMPLANT
GLOVE BIOGEL PI INDICATOR 8 (GLOVE) ×1
GOWN STRL REIN XL XLG (GOWN DISPOSABLE) ×4 IMPLANT
LOOP ANGLED CUTTING 22FR (CUTTING LOOP) IMPLANT
PACK HYSTEROSCOPY LF (CUSTOM PROCEDURE TRAY) ×2 IMPLANT
PAD OB MATERNITY 4.3X12.25 (PERSONAL CARE ITEMS) ×2 IMPLANT
TOWEL OR 17X24 6PK STRL BLUE (TOWEL DISPOSABLE) ×4 IMPLANT
WATER STERILE IRR 1000ML POUR (IV SOLUTION) ×2 IMPLANT

## 2012-12-06 NOTE — Transfer of Care (Signed)
Immediate Anesthesia Transfer of Care Note  Patient: Tiffany Weaver  Procedure(s) Performed: Procedure(s) with comments: DILATATION AND CURETTAGE / Removal of IUD-Mirena and insertion of Mirena IUD (N/A) - IUD removal  Patient Location: PACU  Anesthesia Type:General  Level of Consciousness: awake, alert  and oriented  Airway & Oxygen Therapy: Patient Spontanous Breathing and Patient connected to nasal cannula oxygen  Post-op Assessment: Report given to PACU RN and Post -op Vital signs reviewed and stable  Post vital signs: stable  Complications: No apparent anesthesia complications

## 2012-12-06 NOTE — Op Note (Addendum)
12/06/2012  4:35 PM  PATIENT:  Tiffany Weaver  32 y.o. female  PRE-OPERATIVE DIAGNOSIS:  IUD complications  POST-OPERATIVE DIAGNOSIS:  IUD complications  PROCEDURE:  Procedure(s): DILATATION AND CURETTAGE / Removal of IUD-Mirena and insertion of Mirena IUD  SURGEON:  Surgeon(s): Delbert Harness, MD  PHYSICIAN ASSISTANT:   ASSISTANTS: none   ANESTHESIA:   general  EBL:  Total I/O In: 800 [I.V.:800] Out: 25 [Blood:25]  BLOOD ADMINISTERED:none  DRAINS: none   LOCAL MEDICATIONS USED:  NONE  SPECIMEN:  No Specimen  DISPOSITION OF SPECIMEN:  N/A  COUNTS:  YES  TOURNIQUET:  * No tourniquets in log *  DICTATION: .Note written in EPIC  PLAN OF CARE: Discharge to home after PACU  PATIENT DISPOSITION:  PACU - hemodynamically stable.   Delay start of Pharmacological VTE agent (>24hrs) due to surgical blood loss or risk of bleeding:  {YES/NO/NOT APPLICABLE:20182  Indications: The patient had the Mirena IUD for 5 years with that being completed in February 2014.  She presented to the office and Dr. Neva Seat attempted removal in the office.  The patient wanted another IUD of the Mirena type reinserted.  She is now here for removal and insertion in the operating room.  Findings: Patient's cervix easily dilated to a size 15 Pratt dilator.  The uterus sounded to 9 cm.  Uterus was retroverted.  There are no adnexal masses.  The Randall stone forceps were utilized to remove the IUD on first pass.  The Mirena IUD was inserted without difficulty.  Procedure:.  Patient was seen in the preoperative holding area and was identified and matched with procedure.  The patient was then taken to operating room where she was placed  In supine position and general anesthesia was administered without difficulty.  Patient was then prepped and draped in usual sterile fashion.  Patient underwent active time out to match patient with procedure.  The cervix was grasped with single-tooth tenaculum after  exam under anesthesia revealed the above findings.  A single-sided speculum was placed to assist with this.  The cervix was serially dilated to a size 15 Pratt dilator and then the Randall stone forceps were easily administered and with 1 blind pass the IUD was grasped and removed.    The Mirena IUD was inserted in usual fashion.  The strings were cut at 4 cm.  Silver nitrate was applied to 1 of the tenaculum sites.  Good hemostasis was noted.  All issues and removed the vagina.  Patient was reversed from general anesthesia and placed in supine position taken to the recovery room in awake and stable condition.   The lot # and expiration date were noted in the nursing notes

## 2012-12-06 NOTE — Anesthesia Preprocedure Evaluation (Signed)

## 2012-12-06 NOTE — Anesthesia Postprocedure Evaluation (Signed)
  Anesthesia Post-op Note  Patient: Tiffany Weaver  Procedure(s) Performed: Procedure(s) with comments: DILATATION AND CURETTAGE / Removal of IUD-Mirena and insertion of Mirena IUD (N/A) - IUD removal  Patient is awake and responsive. Pain and nausea are reasonably well controlled. Vital signs are stable and clinically acceptable. Oxygen saturation is clinically acceptable. There are no apparent anesthetic complications at this time. Patient is ready for discharge.

## 2012-12-06 NOTE — Interval H&P Note (Signed)
History and Physical Interval Note:  12/06/2012 3:55 PM  Tiffany Weaver  has presented today for surgery, with the diagnosis of IUD complications  The various methods of treatment have been discussed with the patient and family. After consideration of risks, benefits and other options for treatment, the patient has consented to  Procedure(s) with comments: DILATATION AND CURETTAGE /HYSTEROSCOPY (N/A) - IUD removal and IUD insertion as a surgical intervention .  The patient's history has been reviewed, patient examined, no change in status, stable for surgery.  I have reviewed the patient's chart and labs.  Questions were answered to the patient's satisfaction.     [name] H.

## 2012-12-06 NOTE — H&P (View-Only) (Signed)
Tiffany Weaver is an 31 y.o. female. Here for desired IUD removal and this was attempted to be removed in an outpatient setting by another provider and pt now wishes this to be done in the OR and have another IUD placed at the same time.  Pertinent Gynecological History: Menses: flow is light and irregular occurring approximately every 30 days days without intermenstrual spotting Bleeding: irregular on mirena  Contraception: IUD DES exposure: denies Blood transfusions: none Sexually transmitted diseases: no past history: HSV I and II positive IgG Previous GYN Procedures: Last mammogram: na Date: na Last pap: abnormal: ASCUS Cannot rule out high grade lesion Date: 9/13 colposcopy 10/13 results - pending    Menstrual History:  No LMP recorded. Patient is not currently having periods (Reason: IUD).    Past Medical History  Diagnosis Date  . Medical history non-contributory     Past Surgical History  Procedure Laterality Date  . Cesarean section      Family History  Problem Relation Age of Onset  . Diabetes Mother     Social History:  reports that she has been smoking Cigarettes.  She has a 7.5 pack-year smoking history. She has never used smokeless tobacco. She reports that she does not drink alcohol or use illicit drugs.  Allergies:  Allergies  Allergen Reactions  . Nitrofurantoin Monohyd Macro Hives     (Not in a hospital admission)  Review of Systems  Constitutional: Negative.  Negative for fever, chills and weight loss.  HENT: Negative.  Negative for hearing loss, ear pain, congestion and sore throat.   Eyes: Negative.  Negative for blurred vision, double vision and photophobia.  Respiratory: Negative.  Negative for cough, sputum production, shortness of breath and wheezing.   Cardiovascular: Negative.  Negative for chest pain, palpitations and leg swelling.  Gastrointestinal: Negative.  Negative for nausea, vomiting, abdominal pain, diarrhea and constipation.   Genitourinary: Negative.  Negative for dysuria, urgency, frequency and flank pain.  Musculoskeletal: Negative.  Negative for myalgias, back pain and joint pain.  Skin: Negative.  Negative for itching and rash.  Neurological: Negative.  Negative for dizziness, tingling, tremors, focal weakness and headaches.  Endo/Heme/Allergies: Negative.  Negative for polydipsia.  Psychiatric/Behavioral: Negative.  Negative for depression and suicidal ideas.    There were no vitals taken for this visit. VSS AF Physical Exam  Constitutional: She is oriented to person, place, and time. She appears well-nourished. No distress.  HENT:  Head: Normocephalic and atraumatic.  Right Ear: External ear normal.  Left Ear: External ear normal.  Mouth/Throat: Oropharynx is clear and moist. No oropharyngeal exudate.  Eyes: Conjunctivae and EOM are normal. Pupils are equal, round, and reactive to light. Right eye exhibits no discharge. Left eye exhibits no discharge.  Neck: Normal range of motion. Neck supple. No tracheal deviation present. No thyromegaly present.  Cardiovascular: Normal rate, regular rhythm and normal heart sounds.   No murmur heard. Respiratory: Effort normal and breath sounds normal. No respiratory distress. She has no wheezes. She has no rales.  GI: Soft. She exhibits no distension. There is no tenderness. There is no rebound and no guarding.  Genitourinary:  deferred  Musculoskeletal: Normal range of motion.  Lymphadenopathy:    She has no cervical adenopathy.  Neurological: She is alert and oriented to person, place, and time.  Skin: Skin is warm and dry. She is not diaphoretic.    No results found for this or any previous visit (from the past 24 hour(s)).  No results found.    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 Tiffany Weaver H. 12/06/2012, 10:58 AM  

## 2012-12-06 NOTE — H&P (Signed)
Tiffany Weaver is an 32 y.o. female. Here for desired IUD removal and this was attempted to be removed in an outpatient setting by another provider and pt now wishes this to be done in the OR and have another IUD placed at the same time.  Pertinent Gynecological History: Menses: flow is light and irregular occurring approximately every 30 days days without intermenstrual spotting Bleeding: irregular on mirena  Contraception: IUD DES exposure: denies Blood transfusions: none Sexually transmitted diseases: no past history: HSV I and II positive IgG Previous GYN Procedures: Last mammogram: na Date: na Last pap: abnormal: ASCUS Cannot rule out high grade lesion Date: 9/13 colposcopy 10/13 results - pending    Menstrual History:  No LMP recorded. Patient is not currently having periods (Reason: IUD).    Past Medical History  Diagnosis Date  . Medical history non-contributory     Past Surgical History  Procedure Laterality Date  . Cesarean section      Family History  Problem Relation Age of Onset  . Diabetes Mother     Social History:  reports that she has been smoking Cigarettes.  She has a 7.5 pack-year smoking history. She has never used smokeless tobacco. She reports that she does not drink alcohol or use illicit drugs.  Allergies:  Allergies  Allergen Reactions  . Nitrofurantoin Monohyd Macro Hives     (Not in a hospital admission)  Review of Systems  Constitutional: Negative.  Negative for fever, chills and weight loss.  HENT: Negative.  Negative for hearing loss, ear pain, congestion and sore throat.   Eyes: Negative.  Negative for blurred vision, double vision and photophobia.  Respiratory: Negative.  Negative for cough, sputum production, shortness of breath and wheezing.   Cardiovascular: Negative.  Negative for chest pain, palpitations and leg swelling.  Gastrointestinal: Negative.  Negative for nausea, vomiting, abdominal pain, diarrhea and constipation.   Genitourinary: Negative.  Negative for dysuria, urgency, frequency and flank pain.  Musculoskeletal: Negative.  Negative for myalgias, back pain and joint pain.  Skin: Negative.  Negative for itching and rash.  Neurological: Negative.  Negative for dizziness, tingling, tremors, focal weakness and headaches.  Endo/Heme/Allergies: Negative.  Negative for polydipsia.  Psychiatric/Behavioral: Negative.  Negative for depression and suicidal ideas.    There were no vitals taken for this visit. VSS AF Physical Exam  Constitutional: She is oriented to person, place, and time. She appears well-nourished. No distress.  HENT:  Head: Normocephalic and atraumatic.  Right Ear: External ear normal.  Left Ear: External ear normal.  Mouth/Throat: Oropharynx is clear and moist. No oropharyngeal exudate.  Eyes: Conjunctivae and EOM are normal. Pupils are equal, round, and reactive to light. Right eye exhibits no discharge. Left eye exhibits no discharge.  Neck: Normal range of motion. Neck supple. No tracheal deviation present. No thyromegaly present.  Cardiovascular: Normal rate, regular rhythm and normal heart sounds.   No murmur heard. Respiratory: Effort normal and breath sounds normal. No respiratory distress. She has no wheezes. She has no rales.  GI: Soft. She exhibits no distension. There is no tenderness. There is no rebound and no guarding.  Genitourinary:  deferred  Musculoskeletal: Normal range of motion.  Lymphadenopathy:    She has no cervical adenopathy.  Neurological: She is alert and oriented to person, place, and time.  Skin: Skin is warm and dry. She is not diaphoretic.    No results found for this or any previous visit (from the past 24 hour(s)).  No results found.  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 Tiffany Weaver H. 12/06/2012, 10:58 AM

## 2012-12-06 NOTE — OR Nursing (Addendum)
Insertion of  Mirena IUD into uterus by Dr. Filbert Berthold on 12/06/2012. Lot number [id number]. Expiration date 12/2013. MANUFACTURE [facility]  [employer]

## 2012-12-07 ENCOUNTER — Encounter (HOSPITAL_COMMUNITY): Payer: Self-pay | Admitting: Obstetrics & Gynecology

## 2013-08-07 IMAGING — CR DG HAND COMPLETE 3+V*R*
3 series · 3 of 3 positions shown · non-contrast
Comparison: None.

CLINICAL DATA: Laceration

RIGHT HAND - COMPLETE 3+ VIEW

[x hand pa right]
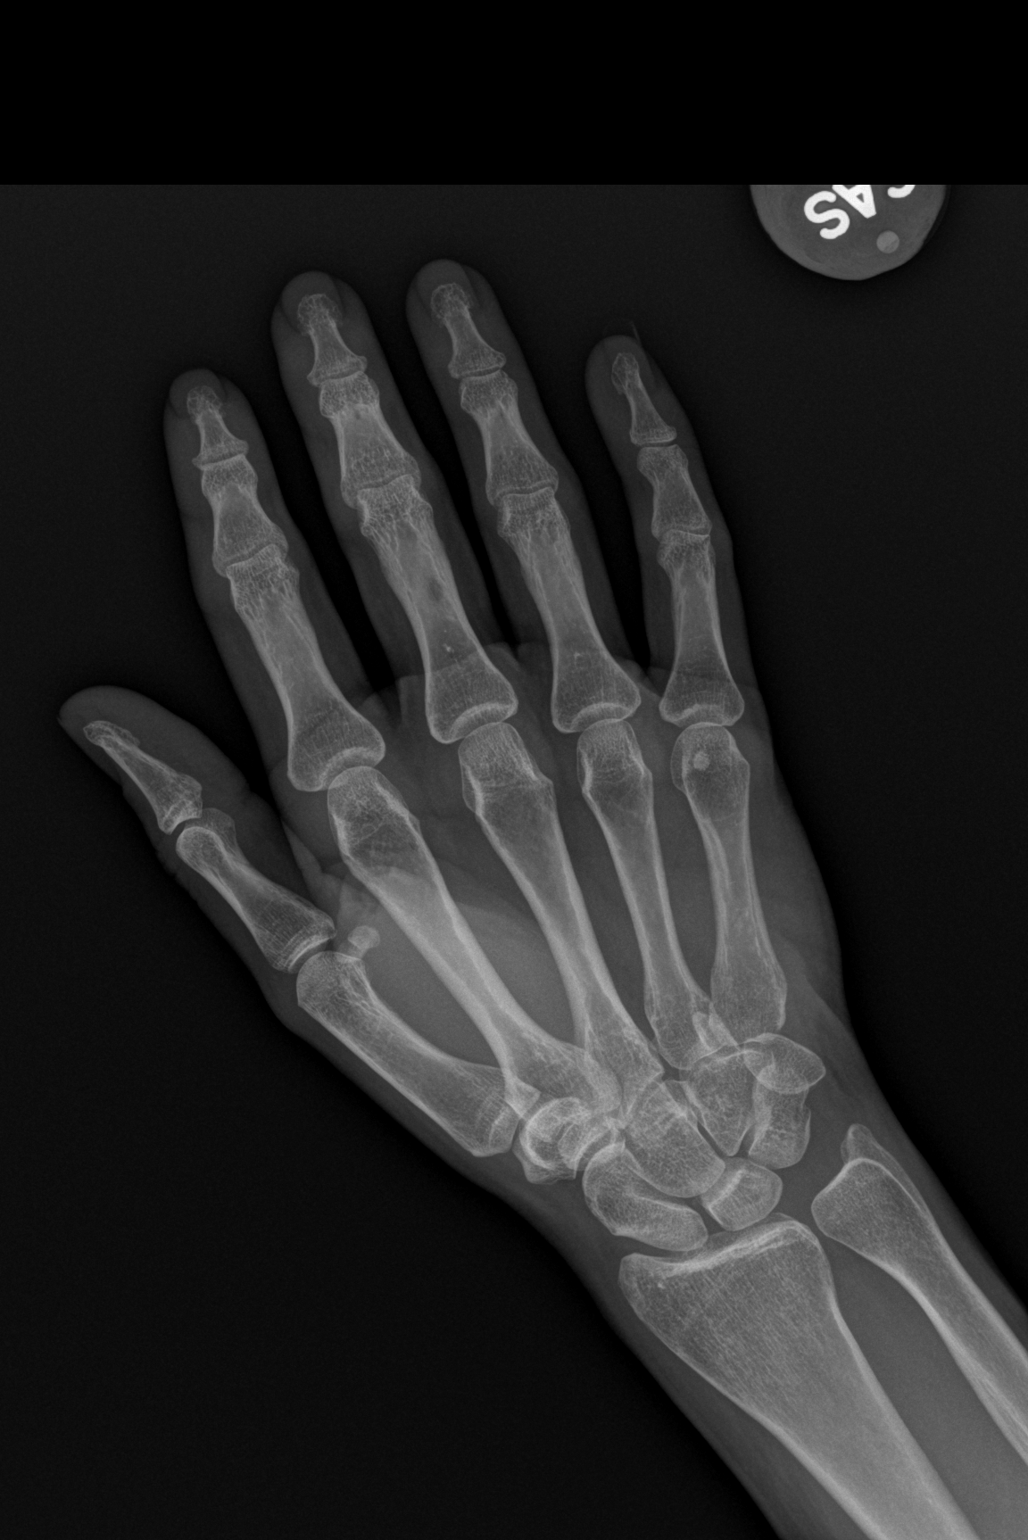

[x hand obl right]
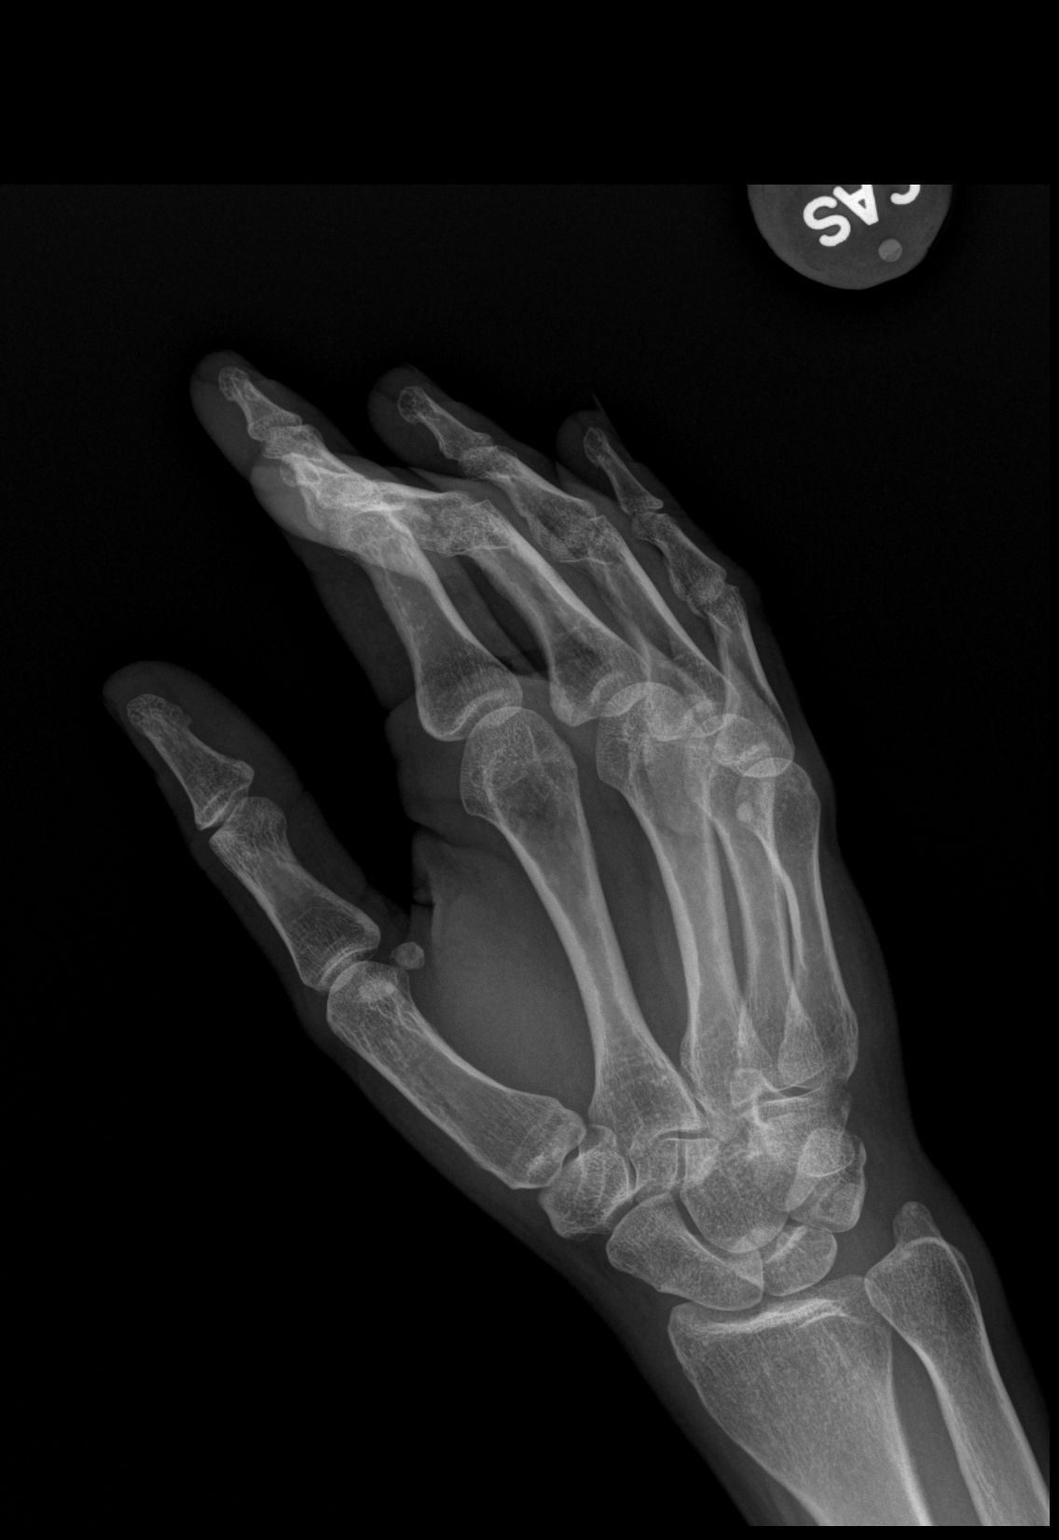

[x hand lat right]
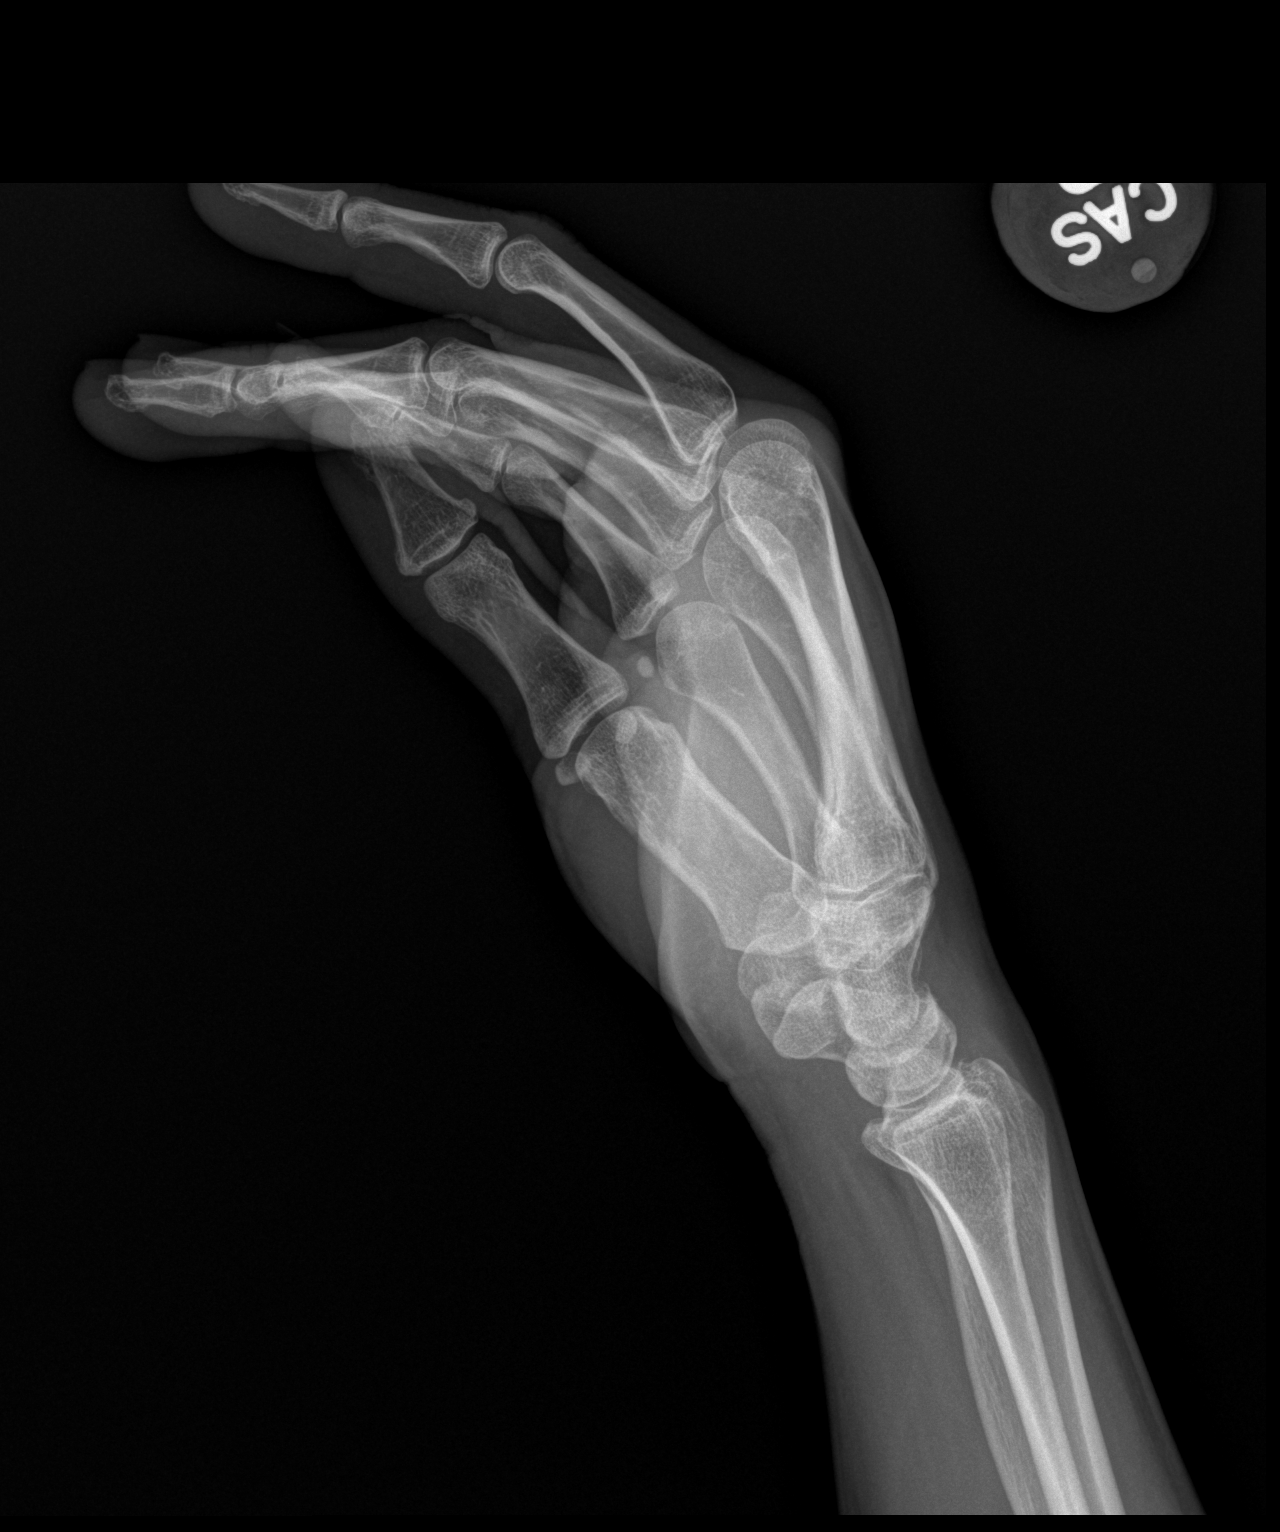

[3 of 3 positions shown; findings below may reference images not displayed]

FINDINGS: No radiodense foreign body.  No subcutaneous emphysema.
Negative for fracture, dislocation, or other acute bony
abnormality.  No significant osseous degenerative change. Normal
mineralization and alignment.
IMPRESSION: 1.  Negative for fracture, foreign body, or other acute
abnormality.

## 2014-02-09 ENCOUNTER — Ambulatory Visit: Payer: Self-pay | Admitting: Emergency Medicine

## 2014-02-09 VITALS — BP 110/69 | HR 94 | Temp 98.3°F | Resp 16 | Ht 66.0 in | Wt 109.6 lb

## 2014-02-09 DIAGNOSIS — Z765 Malingerer [conscious simulation]: Secondary | ICD-10-CM

## 2014-02-09 DIAGNOSIS — F1510 Other stimulant abuse, uncomplicated: Secondary | ICD-10-CM

## 2014-02-09 NOTE — Progress Notes (Signed)
[facility] [address], [place] [id number] [phone]  Date:  02/09/2014   Name:  Tiffany Weaver   DOB:  07/04/1981   MRN:  [id number]  PCP:  No primary provider on file.    Chief Complaint: ADHD  Chaperone:  Lurena Joinerebecca  History of Present Illness:  Tiffany Weaver is a 33 y.o. very pleasant female patient who presents with the following:  Patient has a request for adderall.  She claims a history of childhood use of stimulants followed by a period of use of illicit drugs which she claims are in the past. She said most recently that she was under treatment by an unknown physician in [facility].  She was asked for medical records to corroborate her history and she became agitated and disruptive, stating that I was sending her to prison.  She said she has been abusing methamphetamine and she needs a prescription for adderall to prevent her probation officer from sending her to prison as a parole violation.   I explained in the absence of medical records and in the face of her admitted continuing drug abuse, I could not help her with medication today.  She was vulgar and abusive and left the office in a storm.     There are no active problems to display for this patient.   Past Medical History  Diagnosis Date  . Medical history non-contributory   . Arthritis     Past Surgical History  Procedure Laterality Date  . Cesarean section    . Hysteroscopy w/d&c N/A 12/06/2012    Procedure: DILATATION AND CURETTAGE / Removal of IUD-Mirena and insertion of Mirena IUD;  Surgeon: Delbert Harnessaniel H Moore, MD;  Location: [facility];  Service: Gynecology;  Laterality: N/A;  IUD removal    History  Substance Use Topics  . Smoking status: Current Every Day Smoker -- 0.50 packs/day for 15 years    Types: Cigarettes  . Smokeless tobacco: Never Used  . Alcohol Use: No    Family History  Problem Relation Age of Onset  . Diabetes Mother     Allergies  Allergen Reactions  .  Nitrofurantoin Monohyd Macro Hives    Medication list has been reviewed and updated.  Current Outpatient Prescriptions on File Prior to Visit  Medication Sig Dispense Refill  . ibuprofen (ADVIL,MOTRIN) 200 MG tablet Take 400 mg by mouth every 6 (six) hours as needed for headache.      . Multiple Vitamin (MULTIVITAMIN WITH MINERALS) TABS Take 1 tablet by mouth daily.       No current facility-administered medications on file prior to visit.    Review of Systems:  As per HPI, otherwise negative.    Physical Examination: Filed Vitals:   02/09/14 1204  BP: 110/69  Pulse: 94  Temp: 98.3 F (36.8 C)  Resp: 16   Filed Vitals:   02/09/14 1204  Height: 5\' 6"  (1.676 m)  Weight: 109 lb 9.6 oz (49.714 kg)   Body mass index is 17.7 kg/(m^2). Ideal Body Weight: Weight in (lb) to have BMI = 25: 154.6   GEN: WDWN, NAD, Non-toxic, Alert & Oriented x 3 HEENT: Atraumatic, Normocephalic.  Ears and Nose: No external deformity. EXTR: No clubbing/cyanosis/edema NEURO: Normal gait.  PSYCH: disruptive, frequently interrupting, abusive vulgar language    Assessment and Plan: Methamphetamine abuse, continuous Drug seeking  Signed,  Phillips OdorJeffery Marian Meneely, MD

## 2023-11-25 NOTE — Progress Notes (Unsigned)
 NEW PATIENT Date of Service/Encounter:  11/26/23 Referring provider: Candi Leash, MD Primary care provider: Candi Leash, MD  Subjective:  Tiffany Weaver is a 43 y.o. female presenting today for evaluation of recurrent bronchitis. History obtained from: chart review and patient.   Discussed the use of AI scribe software for clinical note transcription with the patient, who gave verbal consent to proceed.  History of Present Illness   Tiffany Weaver is a 43 year old female who presents with concerns of asthma.  She has experienced recurrent respiratory infections, including bronchitis and pneumonia, since her teenage years. These episodes have increased in frequency over the past few years, occurring every two to three months. She attributes this increase to her toddler attending part-time daycare and frequently bringing home illnesses.  Any minor viral infection, such as a cold, often progresses to a respiratory infection requiring treatment with prednisone and sometimes antibiotics. Without prednisone, her symptoms, which include persistent coughing and a sensation of 'drowning in my lungs,' do not resolve. She describes difficulty catching her breath and a feeling of fluid in her lungs during these episodes.  She uses an albuterol inhaler during respiratory infections, which provides some relief but does not prevent the progression of symptoms. She experiences shortness of breath during physical activity, limiting her exercise tolerance to about 20 minutes.  She quit smoking in 2018 after smoking for approximately 20 years. Following cessation, she did not experience any respiratory illnesses for two years, even when her family contracted the flu. However, her symptoms have since returned.  Her past medical history includes a history of eczema during childhood and an allergy to Macrobid, which causes hives. No seasonal allergies, food allergies, or medication allergies aside from  Macrobid. She receives annual flu and COVID vaccinations.      Chart Review:  Reviewed PCP notes from referral 10/07/23: mild intermittent asthma with exacerbation-tx: zpack and kenolog injection CXR: 2023: no lung findings.   Past Medical History: Past Medical History:  Diagnosis Date   Arthritis    Medical history non-contributory    Medication List:  Current Outpatient Medications  Medication Sig Dispense Refill   albuterol (VENTOLIN HFA) 108 (90 Base) MCG/ACT inhaler Inhale into the lungs.     amphetamine-dextroamphetamine (ADDERALL) 30 MG tablet Take 1 tablet by mouth daily.     ibuprofen (ADVIL,MOTRIN) 200 MG tablet Take 400 mg by mouth every 6 (six) hours as needed for headache.     Multiple Vitamin (MULTIVITAMIN WITH MINERALS) TABS Take 1 tablet by mouth daily.     No current facility-administered medications for this visit.   Known Allergies:  Allergies  Allergen Reactions   Nitrofurantoin Monohyd Macro Hives   Past Surgical History: Past Surgical History:  Procedure Laterality Date   CESAREAN SECTION     HYSTEROSCOPY WITH D & C N/A 12/06/2012   Procedure: DILATATION AND CURETTAGE / Removal of IUD-Mirena and insertion of Mirena IUD;  Surgeon: Delbert Harness, MD;  Location: [facility];  Service: Gynecology;  Laterality: N/A;  IUD removal   Family History: Family History  Problem Relation Age of Onset   Diabetes Mother    Asthma Son    Allergic rhinitis Daughter    Immunodeficiency Neg Hx    Social History: Tiffany Weaver lives in a house built in the 1940s, no water damage, wood floors, electric heating, central AC, 2 indoor dogs, no roaches, using dust mite covers on the bed and the pillows, she is exposed to secondhand smoke.  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  No HEPA filter in the home.  [place]  She did smoke marijuana ultralights 1 to 2 packs/day for 20 years she quit in October 2018 she occasionally vapes .  ROS:  All other  systems negative except as noted per HPI.  Objective:  Blood pressure 120/82, pulse 78, temperature 97.7 F (36.5 C), temperature source Temporal, resp. rate 16, height 5\' 6"  (1.676 m), weight 125 lb (56.7 kg), SpO2 100%. Body mass index is 20.18 kg/m. Physical Exam:  General Appearance:  Alert, cooperative, no distress, appears stated age  Head:  Normocephalic, without obvious abnormality, atraumatic  Eyes:  Conjunctiva clear, EOM's intact  Ears EACs normal bilaterally and normal TMs bilaterally  Nose: Nares normal, hypertrophic turbinates, normal mucosa, no visible anterior polyps, and septum midline  Throat: Lips, tongue normal; teeth and gums normal, normal posterior oropharynx  Neck: Supple, symmetrical  Lungs:   clear to auscultation bilaterally, Respirations unlabored, no coughing  Heart:  regular rate and rhythm and no murmur, Appears well perfused  Extremities: No edema  Skin: Skin color, texture, turgor normal and no rashes or lesions on visualized portions of skin  Neurologic: No gross deficits   Diagnostics: Spirometry:  Tracings reviewed. Her effort: Good reproducible efforts. FVC: 4.26L FEV1: 3.53L, 112% predicted  FEV1/FVC ratio: 0.83  Interpretation: Spirometry consistent with normal pattern.  Please see scanned spirometry results for details.  Labs:  Lab Orders  No laboratory test(s) ordered today     Assessment and Plan  Assessment and Plan    Recurrent bronchitis-concern for mild persistent Asthma: Recurrent respiratory infections requiring prednisone every 2-3 months. Albuterol provides some relief but not long-term. Normal lung function testing but history suggestive of mild, intermittent asthma exacerbated by respiratory illnesses. - your lung testing today looked great, however this does not rule out asthma - Controller Inhaler: Start Flovent 110 mcg 2 puffs twice a day; This Should Be Used Everyday - Rinse mouth out after use -use with spacer for  better medication delivery - During respiratory illness or asthma flares: use your albuterol inhaler 4 puffs twice daily prior to your flovent and continue for 1-2 weeks - Rescue Inhaler: Albuterol (Proair/Ventolin) 2 puffs . Use  every 4-6 hours as needed for chest tightness, wheezing, or coughing.   Can also use 15 minutes prior to exercise if you have symptoms with activity. - Asthma is not controlled if:  - Symptoms are occurring >2 times a week OR  - >2 times a month nighttime awakenings  - You are requiring systemic steroids (prednisone/steroid injections) more than once per year  - Your require hospitalization for your asthma.  - Please call the clinic to schedule a follow up if these symptoms arise Avoid smoke exposure Stay up-to-date with your annual flu vaccines, COVID vaccines and pneumonia vaccines when indicated.  Chronic rhinitis-mild Not currently treating, no specific seasons. Consider environmental allergy testing to identify additional triggers.  Follow up : 3 months, sooner if needed It was a pleasure meeting you in clinic today! Thank you for allowing me to participate in your care.   This note in its entirety was forwarded to the Provider who requested this consultation.  Other: spacer provided in clinic  Thank you for your kind referral. I appreciate the opportunity to take part in Tiffany Weaver care. Please do not hesitate to contact me with questions.  Sincerely,  Tonny Bollman, MD [facility]

## 2023-11-26 ENCOUNTER — Encounter: Payer: Self-pay | Admitting: Internal Medicine

## 2023-11-26 ENCOUNTER — Other Ambulatory Visit: Payer: Self-pay

## 2023-11-26 ENCOUNTER — Ambulatory Visit: Payer: Self-pay | Admitting: Internal Medicine

## 2023-11-26 VITALS — BP 120/82 | HR 78 | Temp 97.7°F | Resp 16 | Ht 66.0 in | Wt 125.0 lb

## 2023-11-26 DIAGNOSIS — J310 Chronic rhinitis: Secondary | ICD-10-CM

## 2023-11-26 DIAGNOSIS — J4530 Mild persistent asthma, uncomplicated: Secondary | ICD-10-CM

## 2023-11-26 MED ORDER — ALBUTEROL SULFATE HFA 108 (90 BASE) MCG/ACT IN AERS: 2.0000 | INHALATION_SPRAY | RESPIRATORY_TRACT | 2 refills | Status: DC | PRN

## 2023-11-26 MED ORDER — FLUTICASONE PROPIONATE HFA 110 MCG/ACT IN AERO: 2.0000 | INHALATION_SPRAY | Freq: Two times a day (BID) | RESPIRATORY_TRACT | 5 refills | Status: DC

## 2023-11-26 NOTE — Patient Instructions (Addendum)
 Recurrent bronchitis-concern for mild persistent  Asthma: Recurrent respiratory infections requiring prednisone every 2-3 months. Albuterol provides some relief but not long-term. Normal lung function testing but history suggestive of mild, intermittent asthma exacerbated by respiratory illnesses. - your lung testing today looked great, however this does not rule out asthma - Controller Inhaler: Start Flovent 110 mcg 2 puffs twice a day; This Should Be Used Everyday - Rinse mouth out after use -use with spacer for better medication delivery - During respiratory illness or asthma flares: use your albuterol inhaler 4 puffs twice daily prior to your flovent and continue for 1-2 weeks - Rescue Inhaler: Albuterol (Proair/Ventolin) 2 puffs . Use  every 4-6 hours as needed for chest tightness, wheezing, or coughing.   Can also use 15 minutes prior to exercise if you have symptoms with activity. - Asthma is not controlled if:  - Symptoms are occurring >2 times a week OR  - >2 times a month nighttime awakenings  - You are requiring systemic steroids (prednisone/steroid injections) more than once per year  - Your require hospitalization for your asthma.  - Please call the clinic to schedule a follow up if these symptoms arise Avoid smoke exposure Stay up-to-date with your annual flu vaccines, COVID vaccines and pneumonia vaccines when indicated.  Chronic rhinitis-mild Not currently treating, no specific seasons. Consider environmental allergy testing to identify additional triggers.  Follow up : 3 months, sooner if needed It was a pleasure meeting you in clinic today! Thank you for allowing me to participate in your care.  Tonny Bollman, MD [facility]

## 2023-12-23 ENCOUNTER — Encounter: Payer: Self-pay | Admitting: Internal Medicine

## 2023-12-24 NOTE — Patient Instructions (Incomplete)
 Recurrent bronchitis-concern for mild persistent  Asthma: Recurrent respiratory infections requiring prednisone every 2-3 months. Albuterol provides some relief but not long-term. Normal lung function testing but history suggestive of mild, intermittent asthma exacerbated by respiratory illnesses. -  - Controller Inhaler: Continue Flovent 110 mcg 2 puffs twice a day; This Should Be Used Everyday - Rinse mouth out after use -use with spacer for better medication delivery - During respiratory illness or asthma flares: use your albuterol inhaler 4 puffs twice daily prior to your flovent and continue for 1-2 weeks - Rescue Inhaler: Albuterol (Proair/Ventolin) 2 puffs . Use  every 4-6 hours as needed for chest tightness, wheezing, or coughing.   Can also use 15 minutes prior to exercise if you have symptoms with activity. - Asthma is not controlled if:  - Symptoms are occurring >2 times a week OR  - >2 times a month nighttime awakenings  - You are requiring systemic steroids (prednisone/steroid injections) more than once per year  - Your require hospitalization for your asthma.  - Please call the clinic to schedule a follow up if these symptoms arise Avoid smoke exposure Stay up-to-date with your annual flu vaccines, COVID vaccines and pneumonia vaccines when indicated.  Chronic rhinitis-mild Not currently treating, no specific seasons. Consider environmental allergy testing to identify additional triggers.  Follow up : months, sooner if needed

## 2023-12-25 ENCOUNTER — Other Ambulatory Visit: Payer: Self-pay

## 2023-12-25 ENCOUNTER — Ambulatory Visit (INDEPENDENT_AMBULATORY_CARE_PROVIDER_SITE_OTHER): Admitting: Family

## 2023-12-25 ENCOUNTER — Encounter: Payer: Self-pay | Admitting: Family

## 2023-12-25 VITALS — BP 128/68 | HR 96 | Temp 97.9°F | Resp 18 | Ht 66.0 in

## 2023-12-25 DIAGNOSIS — J4530 Mild persistent asthma, uncomplicated: Secondary | ICD-10-CM

## 2023-12-25 DIAGNOSIS — J310 Chronic rhinitis: Secondary | ICD-10-CM

## 2023-12-25 DIAGNOSIS — J209 Acute bronchitis, unspecified: Secondary | ICD-10-CM | POA: Diagnosis not present

## 2023-12-25 MED ORDER — BUDESONIDE-FORMOTEROL FUMARATE 80-4.5 MCG/ACT IN AERO: INHALATION_SPRAY | RESPIRATORY_TRACT | 5 refills | Status: DC

## 2023-12-25 NOTE — Progress Notes (Signed)
 [address] [place] [address] Dept: [phone]  FOLLOW UP NOTE  Patient ID: Tiffany Weaver, female    DOB: 1981-05-14  Age: 43 y.o. MRN: [id number] Date of Office Visit: 12/25/2023  Assessment  Chief Complaint: Follow-up and Sinus Problem (Increased sinus issues and runny nose since monday)  HPI Tiffany Weaver is a 43 year old female who presents today for an acute visit.  She was last seen on November 26, 2023 by Dr. Maurine Minister for recurrent bronchitis-concern for mild persistent asthma and chronic rhinitis-mild.  She denies any new diagnosis or surgery since we last saw her.  She reports that on Tuesday last week her whole family got sick and she did also.  Everyone tested negative for influenza and COVID.  Her symptoms started with runny nose and sore throat.  Everyone else started feeling better on Sunday.  Then this past Monday she felt like it was going into her lungs.  She is not wheezing now.  She feels like fluid gets in her lungs.  She will at times cough up chunky yellow sputum and at times it will be a dry cough.  She also will have body aches and chills but no fever.  She mentions it is harder to breeze.  She feels sore in her chest and her cough is waking her up at night.  Since her last office visit she has not required any systemic steroids or made any trips to the emergency room or urgent care due to breathing problems.  She reports that she typically gets bronchitis 4-6 times a year and that steroids are the only thing that helps.  Sometimes she needs an antibiotic.  She has been on approximately 4 steroids this year.  She reports that she has been getting sick since her 89-year-old is in daycare.  She reports postnasal drip and denies rhinorrhea, sinus pressure, and nasal congestion.  She did start taking Flovent 110 mcg 2 puffs twice a day and thought it was working.  She is not really used albuterol much at all.  She does mention that the generic Flovent was going to cost her  $175 every month and wondered what other options she had.  She has tried cold and flu cough medicine.   Drug Allergies:  Allergies  Allergen Reactions   Nitrofurantoin Monohyd Macro Hives    Review of Systems: Negative except as per HPI   Physical Exam: BP 128/68   Pulse 96   Temp 97.9 F (36.6 C) (Temporal)   Resp 18   Ht 5\' 6"  (1.676 m)   SpO2 100%   BMI 20.18 kg/m    Physical Exam Constitutional:      Appearance: Normal appearance.  HENT:     Head: Normocephalic and atraumatic.     Right Ear: Tympanic membrane, ear canal and external ear normal.     Left Ear: Tympanic membrane, ear canal and external ear normal.     Nose: Nose normal.     Mouth/Throat:     Mouth: Mucous membranes are moist.     Pharynx: Oropharynx is clear.  Eyes:     Conjunctiva/sclera: Conjunctivae normal.  Cardiovascular:     Rate and Rhythm: Regular rhythm.     Heart sounds: Normal heart sounds.  Pulmonary:     Effort: Pulmonary effort is normal.     Breath sounds: Normal breath sounds.     Comments: Lungs clear to auscultation Musculoskeletal:     Cervical back: Neck supple.  Skin:  General: Skin is warm.  Neurological:     Mental Status: She is alert and oriented to person, place, and time.  Psychiatric:        Mood and Affect: Mood normal.        Behavior: Behavior normal.        Thought Content: Thought content normal.        Judgment: Judgment normal.     Diagnostics: FVC 3.78 L (97%), FEV1 3.01 L (96%), FEV1/FVC 0.80.  Predicted FVC 3.89 L, predicted FEV1 3.15 L.  Spirometry indicates normal respiratory function.  Assessment and Plan: 1. Acute bronchitis, unspecified organism   2. Mild persistent asthma without complication   3. Chronic rhinitis     Meds ordered this encounter  Medications   budesonide-formoterol (SYMBICORT) 80-4.5 MCG/ACT inhaler    Sig: Inhale 2 puffs twice a day with spacer to help prevent cough and wheeze    Dispense:  1 each    Refill:  5     Patient Instructions  Recurrent bronchitis-concern for mild persistent  Asthma: - discussed that current symptoms are consistent with acute bronchitis. Do not recommend an antibiotic or steroid at this time Recurrent respiratory infections requiring prednisone every 2-3 months. Albuterol provides some relief but not long-term. Normal lung function testing but history suggestive of mild, intermittent asthma exacerbated by respiratory illnesses. - Stop Flovent (fluticasone) 110 mcg - Controller Inhaler: Start Symbicort 80/4.5 mcg 2 puffs twice a day; This Should Be Used Everyday - Rinse mouth out after use -use with spacer for better medication delivery - During respiratory illness or asthma flares: use your albuterol inhaler 4 puffs twice daily prior to your Symbicort 80/4.5 mcg 3 puffs twice a day and continue for 1-2 weeks - Rescue Inhaler: Albuterol (Proair/Ventolin) 2 puffs . Use  every 4-6 hours as needed for chest tightness, wheezing, or coughing.   Can also use 15 minutes prior to exercise if you have symptoms with activity. - Asthma is not controlled if:  - Symptoms are occurring >2 times a week OR  - >2 times a month nighttime awakenings  - You are requiring systemic steroids (prednisone/steroid injections) more than once per year  - Your require hospitalization for your asthma.  - Please call the clinic to schedule a follow up if these symptoms arise Avoid smoke exposure Stay up-to-date with your annual flu vaccines, COVID vaccines and pneumonia vaccines when indicated.  Chronic rhinitis-mild Not currently treating, no specific seasons. Consider environmental allergy testing to identify additional triggers.  Follow up :keep already scheduled follow up appointment on 02/26/24@ 10:15 AM with Dr. Maurine Minister, sooner if needed    Return in about 9 weeks (around 02/26/2024), or if symptoms worsen or fail to improve.    Thank you for the opportunity to care for this patient.  Please  do not hesitate to contact me with questions.  Nehemiah Settle, FNP [facility]

## 2023-12-25 NOTE — Addendum Note (Signed)
 Addended by: Modesto Charon on: 12/25/2023 04:12 PM   Modules accepted: Orders

## 2024-01-01 ENCOUNTER — Encounter: Payer: Self-pay | Admitting: Internal Medicine

## 2024-01-04 NOTE — Telephone Encounter (Signed)
 Please let  Tiffany Weaver know that Symbicort can cause insomnia and tachycardia,but it is not a common side effect. I recommend that she stop Symbicort and lets see if we can get Dulera 50/ 5 mcg 2 puffs twice a day with spacer approved. Please find out what pharmacy she uses and send prescription  I would like for her to get evaluated today  by urgent care due to her other symptoms and shortness of breath

## 2024-01-05 ENCOUNTER — Other Ambulatory Visit: Payer: Self-pay | Admitting: *Deleted

## 2024-01-05 MED ORDER — DULERA 50-5 MCG/ACT IN AERO: 2.0000 | INHALATION_SPRAY | Freq: Two times a day (BID) | RESPIRATORY_TRACT | 5 refills | Status: DC

## 2024-01-08 ENCOUNTER — Other Ambulatory Visit: Payer: Self-pay | Admitting: Family

## 2024-01-08 ENCOUNTER — Encounter: Payer: Self-pay | Admitting: Internal Medicine

## 2024-01-08 MED ORDER — FLUTICASONE-SALMETEROL 45-21 MCG/ACT IN AERO
2.0000 | INHALATION_SPRAY | Freq: Two times a day (BID) | RESPIRATORY_TRACT | 3 refills | Status: DC
Start: 2024-01-08 — End: 2024-02-26

## 2024-01-08 MED ORDER — DULERA 50-5 MCG/ACT IN AERO: 2.0000 | INHALATION_SPRAY | Freq: Two times a day (BID) | RESPIRATORY_TRACT | 5 refills | Status: DC

## 2024-01-09 MED ORDER — FLUTICASONE-SALMETEROL 100-50 MCG/ACT IN AEPB: 1.0000 | INHALATION_SPRAY | Freq: Two times a day (BID) | RESPIRATORY_TRACT | 3 refills | Status: DC

## 2024-01-11 ENCOUNTER — Telehealth: Payer: Self-pay

## 2024-01-12 NOTE — Telephone Encounter (Signed)
 It looks like she was already changed to advair? Can someone see if she was able to get this medication?

## 2024-01-14 ENCOUNTER — Encounter: Payer: Self-pay | Admitting: *Deleted

## 2024-01-15 ENCOUNTER — Other Ambulatory Visit: Payer: Self-pay | Admitting: *Deleted

## 2024-01-15 MED ORDER — FLUTICASONE-SALMETEROL 100-50 MCG/ACT IN AEPB: 1.0000 | INHALATION_SPRAY | Freq: Two times a day (BID) | RESPIRATORY_TRACT | 3 refills | Status: DC

## 2024-01-15 NOTE — Telephone Encounter (Signed)
 Dr. Cornel Diesel sent in fluticasone /salmeterol over the weekend after back-and-forth concerns with insurance coverage.  Can someone please call and see what medication she is taking?

## 2024-01-15 NOTE — Telephone Encounter (Signed)
 Old message med sent in

## 2024-01-15 NOTE — Telephone Encounter (Signed)
 Noted. Thank kyou

## 2024-02-26 ENCOUNTER — Other Ambulatory Visit: Payer: Self-pay

## 2024-02-26 ENCOUNTER — Ambulatory Visit: Payer: Self-pay | Admitting: Internal Medicine

## 2024-02-26 ENCOUNTER — Encounter: Payer: Self-pay | Admitting: Internal Medicine

## 2024-02-26 VITALS — BP 122/74 | HR 82 | Temp 98.4°F | Resp 18 | Ht 66.0 in | Wt 128.0 lb

## 2024-02-26 DIAGNOSIS — J310 Chronic rhinitis: Secondary | ICD-10-CM

## 2024-02-26 DIAGNOSIS — J4530 Mild persistent asthma, uncomplicated: Secondary | ICD-10-CM

## 2024-02-26 MED ORDER — ALBUTEROL SULFATE HFA 108 (90 BASE) MCG/ACT IN AERS: 2.0000 | INHALATION_SPRAY | RESPIRATORY_TRACT | 2 refills | Status: AC | PRN

## 2024-02-26 MED ORDER — FLUTICASONE-SALMETEROL 100-50 MCG/ACT IN AEPB: 1.0000 | INHALATION_SPRAY | Freq: Two times a day (BID) | RESPIRATORY_TRACT | 5 refills | Status: DC

## 2024-02-26 NOTE — Patient Instructions (Addendum)
 Recurrent bronchitis-concern for mild persistent Asthma: Recurrent respiratory infections requiring prednisone every 2-3 months. Albuterol  provides some relief but not long-term. Normal lung function testing but history suggestive of mild, intermittent asthma exacerbated by respiratory illnesses. Interval Hx: doing well without bronchitis since last visit - Controller Inhaler: Advair 100/50 mcg 1 puff twice a day; This Should Be Used Everyday - Rinse mouth out after use - During respiratory illness or asthma flares: use your albuterol  inhaler 4 puffs twice daily prior to your Symbicort  80/4.5 mcg 3 puffs twice a day and continue for 1-2 weeks - Rescue Inhaler: Albuterol  (Proair /Ventolin ) 2 puffs . Use  every 4-6 hours as needed for chest tightness, wheezing, or coughing.   Can also use 15 minutes prior to exercise if you have symptoms with activity. - Asthma is not controlled if:  - Symptoms are occurring >2 times a week OR  - >2 times a month nighttime awakenings  - You are requiring systemic steroids (prednisone/steroid injections) more than once per year  - Your require hospitalization for your asthma.  - Please call the clinic to schedule a follow up if these symptoms arise Avoid smoke exposure Stay up-to-date with your annual flu vaccines, COVID vaccines and pneumonia vaccines when indicated.  Chronic rhinitis-mild Not currently treating, no specific seasons. Consider environmental allergy testing to identify additional triggers.  Follow up :6 months, sooner if needed.  It was a pleasure seeing you again in clinic today! Thank you for allowing me to participate in your care.  Jonathon Neighbors, MD [facility]

## 2024-02-26 NOTE — Progress Notes (Signed)
 FOLLOW UP Date of Service/Encounter:  02/26/24  Subjective:  Tiffany Weaver (DOB: 1981-05-13) is a 43 y.o. female who [facility] in re-evaluation of the following: recurrent bronchitis, chronic rhinitis History obtained from: chart review and patient.  For Review, LV was on 12/25/23  with Tinnie Forehand, FNP seen for routine follow-up. See below for summary of history and diagnostics.   Therapeutic plans/changes recommended: she was diagnosed with acute bronchitis and switched from flovent  110 to symbicort  80, FEV1 96% ----------------------------------------------------- Pertinent History/Diagnostics:  Recurrent bronchitis-concern for mild persistent Asthma: Recurrent respiratory infections requiring prednisone every 2-3 months. Albuterol  provides some relief but not long-term. Normal lung function testing but history suggestive of mild, intermittent asthma exacerbated by respiratory illnesses. - lung testing on 11/26/23 was normal, asymptomatic at the time. - current plan: Flovent  110 mcg 2 puffs BID with spacer, albuterol  PRN Chronic rhinitis-mild Not currently treating, no specific seasons. Consider environmental allergy testing to identify additional triggers. Other: currently self pay --------------------------------------------------- Today presents for follow-up. Discussed the use of AI scribe software for clinical note transcription with the patient, who gave verbal consent to proceed.  History of Present Illness   Tiffany Weaver is a 43 year old female with viral-induced asthma who presents for a follow-up regarding her asthma management.  Her asthma symptoms are primarily triggered by viral infections. Symptoms do not manifest until she contracts a virus. Symptoms tend to linger in her lungs longer than they do for her family members, who recover from similar viral infections within a few days. She uses albuterol  during that  time which helps significantly but effects are short lived.   She is currently using a discus inhaler-Advair 100, which costs fifty dollars a month, and finds it manageable compared to other medications that were more expensive. She has used Ventolin  (albuterol ) during illness, which provides temporary relief for about four hours, but does not offer long-lasting effects. She has also tried Flovent  in the past, but it elevated her heart rate and affected her sleep and did not prevent her from having a flare of bronchitis.  During her last illness, she was unable to start her inhaler treatment until after the bronchitis had developed, so she is unsure of its effectiveness in preventing or mitigating her symptoms.   She is concerned about the financial burden of her medical expenses, especially after losing her insurance, and notes that her medical costs are higher than her student loans.  No recent viral infections or bouts of bronchitis.       All medications reviewed by clinical staff and updated in chart. No new pertinent medical or surgical history except as noted in HPI.  ROS: All others negative except as noted per HPI.   Objective:  BP 122/74 (BP Location: Left Arm, Patient Position: Sitting, Cuff Size: Normal)   Pulse 82   Temp 98.4 F (36.9 C) (Oral)   Resp 18   Ht 5\' 6"  (1.676 m)   Wt 128 lb (58.1 kg)   SpO2 99%   BMI 20.66 kg/m  Body mass index is 20.66 kg/m. Physical Exam: General Appearance:  Alert, cooperative, no distress, appears stated age  Head:  Normocephalic, without obvious abnormality, atraumatic  Eyes:  Conjunctiva clear, EOM's intact  Ears EACs normal bilaterally and normal TMs bilaterally  Nose: Nares normal, hypertrophic turbinates, normal mucosa, and no visible anterior polyps  Throat: Lips, tongue normal; teeth and gums normal, normal posterior oropharynx  Neck: Supple, symmetrical  Lungs:   clear to auscultation bilaterally, Respirations unlabored, no  coughing  Heart:  regular rate and rhythm and no murmur, Appears well perfused  Extremities: No edema  Skin: Skin color, texture, turgor normal and no rashes or lesions on visualized portions of skin  Neurologic: No gross deficits   Labs:  Lab Orders  No laboratory test(s) ordered today  Spirometry deferred due to cost.  Assessment/Plan   Recurrent bronchitis-concern for mild persistent Asthma: stable Recurrent respiratory infections requiring prednisone every 2-3 months. Albuterol  provides some relief but not long-term. Normal lung function testing but history suggestive of mild, intermittent asthma exacerbated by respiratory illnesses. Interval Hx: doing well without bronchitis since last visit - Controller Inhaler: Advair 100/50 mcg 1 puff twice a day; This Should Be Used Everyday - Rinse mouth out after use - During respiratory illness or asthma flares: use your albuterol  inhaler 4 puffs twice daily prior to your Symbicort  80/4.5 mcg 3 puffs twice a day and continue for 1-2 weeks - Rescue Inhaler: Albuterol  (Proair /Ventolin ) 2 puffs . Use  every 4-6 hours as needed for chest tightness, wheezing, or coughing.   Can also use 15 minutes prior to exercise if you have symptoms with activity. - Asthma is not controlled if:  - Symptoms are occurring >2 times a week OR  - >2 times a month nighttime awakenings  - You are requiring systemic steroids (prednisone/steroid injections) more than once per year  - Your require hospitalization for your asthma.  - Please call the clinic to schedule a follow up if these symptoms arise Avoid smoke exposure Stay up-to-date with your annual flu vaccines, COVID vaccines and pneumonia vaccines when indicated.  Chronic rhinitis-mild; controlled Not currently treating, no specific seasons. Consider environmental allergy testing to identify additional triggers.  Follow up :6 months, sooner if needed.  It was a pleasure seeing you again in clinic  today! Thank you for allowing me to participate in your care.  Other: none  Jonathon Neighbors, MD  [facility] 

## 2024-05-16 ENCOUNTER — Encounter: Payer: Self-pay | Admitting: Internal Medicine

## 2024-05-27 ENCOUNTER — Telehealth: Payer: Self-pay | Admitting: Family Medicine

## 2024-05-27 DIAGNOSIS — J4530 Mild persistent asthma, uncomplicated: Secondary | ICD-10-CM

## 2024-05-27 DIAGNOSIS — J209 Acute bronchitis, unspecified: Secondary | ICD-10-CM

## 2024-05-27 MED ORDER — FLUTICASONE-SALMETEROL 100-50 MCG/ACT IN AEPB: 1.0000 | INHALATION_SPRAY | Freq: Two times a day (BID) | RESPIRATORY_TRACT | 5 refills | Status: DC

## 2024-05-27 NOTE — Telephone Encounter (Signed)
 Sent Rx for Advair to [facility] in [address] patient aware.

## 2024-05-27 NOTE — Telephone Encounter (Signed)
 PT called to advise could not find maint inhaler and was hoping another rx could be filled, and I advised would send to provider

## 2024-05-27 NOTE — Telephone Encounter (Signed)
 Can we resend her maintenance inhaler?

## 2024-06-21 ENCOUNTER — Encounter: Payer: Self-pay | Admitting: Internal Medicine

## 2024-06-21 NOTE — Telephone Encounter (Signed)
 Spoke with patient informed per Dr. Lorin she needs an appointment patient unable to come in office today state she has other appointments and scheduled to see Dr. Marinda on 06/23/24 at 11:15 am.

## 2024-06-23 ENCOUNTER — Ambulatory Visit (INDEPENDENT_AMBULATORY_CARE_PROVIDER_SITE_OTHER): Payer: Self-pay | Admitting: Internal Medicine

## 2024-06-23 ENCOUNTER — Ambulatory Visit: Payer: Self-pay | Admitting: Internal Medicine

## 2024-06-23 ENCOUNTER — Encounter: Payer: Self-pay | Admitting: Internal Medicine

## 2024-06-23 ENCOUNTER — Other Ambulatory Visit: Payer: Self-pay

## 2024-06-23 VITALS — BP 126/78 | HR 93 | Temp 99.3°F | Resp 18 | Ht 66.0 in | Wt 125.2 lb

## 2024-06-23 DIAGNOSIS — J310 Chronic rhinitis: Secondary | ICD-10-CM | POA: Insufficient documentation

## 2024-06-23 DIAGNOSIS — J4531 Mild persistent asthma with (acute) exacerbation: Secondary | ICD-10-CM | POA: Insufficient documentation

## 2024-06-23 DIAGNOSIS — J189 Pneumonia, unspecified organism: Secondary | ICD-10-CM | POA: Insufficient documentation

## 2024-06-23 MED ORDER — FLUTICASONE-SALMETEROL 250-50 MCG/ACT IN AEPB: 1.0000 | INHALATION_SPRAY | Freq: Two times a day (BID) | RESPIRATORY_TRACT | 5 refills | Status: AC

## 2024-06-23 MED ORDER — PREDNISONE 20 MG PO TABS: 40.0000 mg | ORAL_TABLET | Freq: Every day | ORAL | 0 refills | Status: AC

## 2024-06-23 MED ORDER — METHYLPREDNISOLONE ACETATE 80 MG/ML IJ SUSP
80.0000 mg | Freq: Once | INTRAMUSCULAR | Status: AC
  Administered 2024-06-23: 80 mg via INTRAMUSCULAR

## 2024-06-23 NOTE — Patient Instructions (Addendum)
 Mild persistent asthma with exacerbation, current pneumonia She is finishing out a round of Augmentin plus doxycycline for pneumonia confirmed on chest x-ray with left basilar infiltrate Still feels puny with difficulty breathing Exam yields wheezing on expiration throughout, no crackles. 80 mg IM Depo given in clinic Tomorrow start prednisone 40 mg take by mouth daily for 4 days then stop. If not starting to feel some improvement by the weekend, please go to the emergency room for repeat imaging. Will obtain labs to look at humeral (antibody) immune function. Will obtain labs to see if candidate for injectable asthma medications. Labs will need to be obtained 3 weeks from the completion of your steroids.  - Controller Inhaler: Advair 250/50 mcg 1 puff twice a day; This Should Be Used Everyday. Will replace Advair 100. - Rinse mouth out after use - During respiratory illness or asthma flares: use your albuterol  inhaler 4 puffs twice daily prior to your Symbicort  80/4.5 mcg 3 puffs twice a day and continue for 1-2 weeks - Rescue Inhaler: Albuterol  (Proair /Ventolin ) 2 puffs . Use  every 4-6 hours as needed for chest tightness, wheezing, or coughing.   Can also use 15 minutes prior to exercise if you have symptoms with activity. - Asthma is not controlled if:  - Symptoms are occurring >2 times a week OR  - >2 times a month nighttime awakenings  - You are requiring systemic steroids (prednisone/steroid injections) more than once per year  - Your require hospitalization for your asthma.  - Please call the clinic to schedule a follow up if these symptoms arise Avoid smoke exposure Stay up-to-date with your annual flu vaccines, COVID vaccines and pneumonia vaccines when indicated.  Chronic rhinitis-mild Not currently treating, no specific seasons. Consider environmental allergy testing to identify additional triggers. Labs for environmental allergies  Enlarged tonsils, left significantly  greater than right, Will evaluate again at follow-up and if asymmetry remains, refer to ENT for consideration of biopsy.  Follow up : 4 weeks, sooner if needed.  It was a pleasure seeing you again in clinic today! Thank you for allowing me to participate in your care.  Rocky Endow, MD [facility]

## 2024-06-23 NOTE — Progress Notes (Signed)
 FOLLOW UP Date of Service/Encounter:   06/23/2024  Subjective:  Tiffany Weaver (DOB: [birth year]) is a 43 y.o. female who [facility]24 in re-evaluation of the following: Acute visit for asthma exacerbation in setting of recent pneumonia History obtained from: chart review and patient.  For Review, LV was on 02/26/24  with Dr.Kaide Gage seen for routine follow-up. See below for summary of history and diagnostics.   Therapeutic plans/changes recommended: At that visit doing well on Advair 101 puff twice daily.  Financially restricted due to loss of insurance. ----------------------------------------------------- Pertinent History/Diagnostics:  Recurrent bronchitis-concern for mild persistent Asthma: Recurrent respiratory infections requiring prednisone every 2-3 months. Albuterol  provides some relief but not long-term. Normal lung function testing but history suggestive of mild, intermittent asthma exacerbated by respiratory illnesses. - lung testing on 11/26/23 was normal, asymptomatic at the time. - current plan: Flovent  110 mcg 2 puffs BID with spacer, albuterol  PRN Chronic rhinitis-mild Not currently treating, no specific seasons. Consider environmental allergy testing to identify additional triggers. Other: currently self pay, financially restricted due to loss of insurance --------------------------------------------------- Today presents for follow-up. Discussed the use of AI scribe software for clinical note transcription with the patient, who gave verbal consent to proceed.  History of Present Illness Tiffany Weaver is a 43 year old female with recurrent pneumonia who presents with persistent respiratory symptoms.  Recurrent lower respiratory tract infections - Recurrent pneumonia, most recent episode began approximately four weeks ago - Initial symptoms suggestive of bronchitis, treated with amoxicillin without improvement -  Progression to bacterial pneumonia confirmed by chest x-ray and blood tests - Treated with doxycycline and amoxicillin-clavulanate for nine days, completes course tomorrow - No frequent sinus infections or other infections requiring antibiotics - Respiratory infections typically originate from the lungs, not upper respiratory tract - Attributes some infections to exposure from her three-year-old child  Persistent respiratory symptoms - Persistent 'hot' sensation in the lungs - Pleuritic pain with deep inspiration - Sensation of crackling in the lungs upon exhalation - Dyspnea and lack of energy - Body aches present - Fever resolved by the second day of antibiotics, but ongoing sensation of feeling feverish - No current fever  Medication use and response - Current medications include Advair 100, used more frequently than prescribed due to symptoms-using about 4 times daily - Albuterol  used approximately three times in the past week - Received a steroid injection last Tuesday, no additional systemic steroids - Feverish sensation improved slightly since starting antibiotics, but significant discomfort and respiratory symptoms persist  Tobacco use history - Smoked heavily for 23 years, quit in her late twenties - No similar lung issues since quitting smoking  Chart Review: She is taking doxycycline + augmentin for pneumonia. CXR 06/14/24: Patchy left basilar infiltrate. Would recommend serial chest x-rays to follow this process to resolution.   All medications reviewed by clinical staff and updated in chart. No new pertinent medical or surgical history except as noted in HPI.  ROS: All others negative except as noted per HPI.   Objective:  BP 126/78 (BP Location: Right Arm, Patient Position: Sitting, Cuff Size: Normal)   Pulse 93   Temp 99.3 F (37.4 C) (Temporal)   Resp 18   Ht 5' 6 (1.676 m)   Wt 125 lb 3.2 oz (56.8 kg)   SpO2 98%   BMI 20.21 kg/m  Body mass index is 20.21  kg/m. Physical Exam: General Appearance:  Alert, cooperative, no distress, appears stated age  Head:  Normocephalic, without obvious abnormality, atraumatic  Eyes:  Conjunctiva clear, EOM's intact  Ears EACs normal bilaterally and normal TMs bilaterally  Nose: Nares normal, normal mucosa  Throat: Lips, tongue normal; teeth and gums normal, left tonsil 3+ and right tonsil 1+, no exudate  Neck: Supple, symmetrical  Lungs:   end-expiratory wheezing, Respirations unlabored, no coughing  Heart:  regular rate and rhythm and no murmur, Appears well perfused  Extremities: No edema  Skin: Skin color, texture, turgor normal and no rashes or lesions on visualized portions of skin  Neurologic: No gross deficits   Labs:  Lab Orders  No laboratory test(s) ordered today   Assessment/Plan   Mild persistent asthma with exacerbation, current pneumonia She is finishing out a round of Augmentin plus doxycycline for pneumonia confirmed on chest x-ray with left basilar infiltrate Still feels puny with difficulty breathing Exam yields wheezing on expiration throughout, no crackles. 80 mg IM Depo given in clinic Tomorrow start prednisone 40 mg take by mouth daily for 4 days then stop. If not starting to feel some improvement by the weekend, please go to the emergency room for repeat imaging. Will obtain labs to look at humeral (antibody) immune function. Will obtain labs to see if candidate for injectable asthma medications. Labs will need to be obtained 3 weeks from the completion of your steroids.  - Controller Inhaler: Advair 250/50 mcg 1 puff twice a day; This Should Be Used Everyday. Will replace Advair 100. - Rinse mouth out after use - During respiratory illness or asthma flares: use your albuterol  inhaler 4 puffs twice daily prior to your Symbicort  80/4.5 mcg 3 puffs twice a day and continue for 1-2 weeks - Rescue Inhaler: Albuterol  (Proair /Ventolin ) 2 puffs . Use  every 4-6 hours as needed for  chest tightness, wheezing, or coughing.   Can also use 15 minutes prior to exercise if you have symptoms with activity. - Asthma is not controlled if:  - Symptoms are occurring >2 times a week OR  - >2 times a month nighttime awakenings  - You are requiring systemic steroids (prednisone/steroid injections) more than once per year  - Your require hospitalization for your asthma.  - Please call the clinic to schedule a follow up if these symptoms arise Avoid smoke exposure Stay up-to-date with your annual flu vaccines, COVID vaccines and pneumonia vaccines when indicated.  Chronic rhinitis-mild Not currently treating, no specific seasons. Consider environmental allergy testing to identify additional triggers. Labs for environmental allergies  Enlarged tonsils, left significantly greater than right, Will evaluate again at follow-up and if asymmetry remains, refer to ENT for consideration of biopsy.  Follow up : 4 weeks, sooner if needed.  It was a pleasure seeing you again in clinic today! Thank you for allowing me to participate in your care.  Other: none  Rocky Endow, MD  [facility] 

## 2024-07-21 ENCOUNTER — Ambulatory Visit: Payer: Self-pay | Admitting: Internal Medicine

## 2024-07-21 ENCOUNTER — Other Ambulatory Visit: Payer: Self-pay

## 2024-07-21 ENCOUNTER — Encounter: Payer: Self-pay | Admitting: Internal Medicine

## 2024-07-21 VITALS — BP 124/74 | HR 105 | Temp 98.2°F | Resp 18 | Wt 126.8 lb

## 2024-07-21 DIAGNOSIS — J310 Chronic rhinitis: Secondary | ICD-10-CM

## 2024-07-21 DIAGNOSIS — J4530 Mild persistent asthma, uncomplicated: Secondary | ICD-10-CM

## 2024-07-21 DIAGNOSIS — J189 Pneumonia, unspecified organism: Secondary | ICD-10-CM

## 2024-07-21 DIAGNOSIS — J358 Other chronic diseases of tonsils and adenoids: Secondary | ICD-10-CM | POA: Insufficient documentation

## 2024-07-21 NOTE — Patient Instructions (Addendum)
 Mild persistent asthma recent exacerbatino due to pneumonia now resolved Will obtain labs to look at humoral (antibody) immune function. Will obtain labs to see if candidate for injectable asthma medications.  - Controller Inhaler: Advair 250/50 mcg 1 puff twice a day; This Should Be Used Everyday. Will replace Advair 100. - Rinse mouth out after use - During respiratory illness or asthma flares: use your albuterol  inhaler 4 puffs twice daily prior to your Advair 2 puffs twice a day and continue for 1-2 weeks - Rescue Inhaler: Albuterol  (Proair /Ventolin ) 2 puffs . Use  every 4-6 hours as needed for chest tightness, wheezing, or coughing.   Can also use 15 minutes prior to exercise if you have symptoms with activity. - Asthma is not controlled if:  - Symptoms are occurring >2 times a week OR  - >2 times a month nighttime awakenings  - You are requiring systemic steroids (prednisone/steroid injections) more than once per year  - Your require hospitalization for your asthma.  - Please call the clinic to schedule a follow up if these symptoms arise Avoid smoke exposure Stay up-to-date with your annual flu vaccines, COVID vaccines and pneumonia vaccines when indicated.  Chronic rhinitis-mild Not currently treating, no specific seasons. Consider environmental allergy testing to identify additional triggers. Labs for environmental allergies  Enlarged tonsils, left significantly greater than right,  Refer to ENT   Follow up : 4 months, sooner if needed.  It was a pleasure seeing you again in clinic today! Thank you for allowing me to participate in your care.  Rocky Endow, MD [facility] [place]

## 2024-07-21 NOTE — Progress Notes (Signed)
 FOLLOW UP Date of Service/Encounter:   07/21/2024  Subjective:  Tiffany Weaver (DOB: 03/13/1981) is a 43 y.o. female who [facility] 07[facility]24 in re-evaluation of the following: Mild persistent asthma, chronic rhinitis, asymmetric tonsils History obtained from: chart review and patient.  For Review, LV was on 06/23/2024 with Dr.Riyanna Crutchley seen for acute visit for asthma exacerbation in setting of pneumonia. See below for summary of history and diagnostics.   Therapeutic plans/changes recommended: She was instructed to finish out antibiotics course as previously provided by her primary care, she was given another round of prednisone.  We discussed obtaining labs to look at immune dysfunction as well as candidacy for asthma Biologics.  Tonsillar asymmetry noted in presence of infection.  Plan to follow up next visit ----------------------------------------------------- Pertinent History/Diagnostics:  Recurrent bronchitis-concern for mild persistent Asthma: Recurrent respiratory infections requiring prednisone every 2-3 months. Albuterol  provides some relief but not long-term. Normal lung function testing but history suggestive of mild, intermittent asthma exacerbated by respiratory illnesses. - lung testing on 11/26/23 was normal, asymptomatic at the time. - current plan: Flovent  110 mcg 2 puffs BID with spacer, albuterol  PRN Chronic rhinitis-mild Not currently treating, no specific seasons. Consider environmental allergy testing to identify additional triggers. Other: currently self pay, financially restricted due to loss of insurance --------------------------------------------------- Today presents for follow-up. Discussed the use of AI scribe software for clinical note transcription with the patient, who gave verbal consent to proceed.  History of Present Illness Tiffany Weaver is a 43 year old female with asthma who presents for follow-up after  completing her treatment plan.  Asthma symptoms and exacerbations - Improved respiratory symptoms after completing treatment plan, including a second prednisone injection - No recent asthma exacerbations - No increased use of rescue inhaler beyond routine use prior to maintenance inhaler which she has not stopped following last exacerbation though unclear that she continues to require this  Inhaler and medication use - Currently using Advair powder inhaler, one puff twice daily - Uses albuterol  rescue inhaler a couple of times before taking maintenance inhaler - No need for additional rescue inhaler use outside of routine     All medications reviewed by clinical staff and updated in chart. No new pertinent medical or surgical history except as noted in HPI.  ROS: All others negative except as noted per HPI.   Objective:  BP 124/74   Pulse (!) 105   Temp 98.2 F (36.8 C) (Oral)   Resp 18   Wt 126 lb 12.8 oz (57.5 kg)   SpO2 99%   BMI 20.47 kg/m  Body mass index is 20.47 kg/m. Physical Exam: General Appearance:  Alert, cooperative, no distress, appears stated age  Head:  Normocephalic, without obvious abnormality, atraumatic  Eyes:  Conjunctiva clear, EOM's intact  Ears EACs normal bilaterally and normal TMs bilaterally  Nose: Nares normal, hypertrophic turbinates, normal mucosa, and no visible anterior polyps  Throat: Lips, tongue normal; teeth and gums normal, asymmetric tonsils, left tonsil 2+, right tonsil 1+  Neck: Supple, symmetrical  Lungs:   clear to auscultation bilaterally, Respirations unlabored, no coughing  Heart:  regular rate and rhythm and no murmur, Appears well perfused  Extremities: No edema  Skin: Skin color, texture, turgor normal and no rashes or lesions on visualized portions of skin  Neurologic: No gross deficits   Labs:  Lab Orders  No laboratory test(s) ordered today    Assessment/Plan   Mild persistent asthma recent exacerbatino due to  pneumonia now resolved Will obtain labs to look at humoral (antibody) immune function. Will obtain labs to see if candidate for injectable asthma medications.  - Controller Inhaler: Advair 250/50 mcg 1 puff twice a day; This Should Be Used Everyday. Will replace Advair 100. - Rinse mouth out after use - During respiratory illness or asthma flares: use your albuterol  inhaler 4 puffs twice daily prior to your Advair 2 puffs twice a day and continue for 1-2 weeks - Rescue Inhaler: Albuterol  (Proair /Ventolin ) 2 puffs . Use  every 4-6 hours as needed for chest tightness, wheezing, or coughing.   Can also use 15 minutes prior to exercise if you have symptoms with activity. - Asthma is not controlled if:  - Symptoms are occurring >2 times a week OR  - >2 times a month nighttime awakenings  - You are requiring systemic steroids (prednisone/steroid injections) more than once per year  - Your require hospitalization for your asthma.  - Please call the clinic to schedule a follow up if these symptoms arise Avoid smoke exposure Stay up-to-date with your annual flu vaccines, COVID vaccines and pneumonia vaccines when indicated.  Chronic rhinitis-mild Not currently treating, no specific seasons. Consider environmental allergy testing to identify additional triggers. Labs for environmental allergies  Enlarged tonsils, left significantly greater than right, similar in size to previous visit Refer to ENT   Follow up : 4 months, sooner if needed.  It was a pleasure seeing you again in clinic today! Thank you for allowing me to participate in your care.  Rocky Endow, MD [facility] [place]  Other: none  Rocky Endow, MD  [facility] [place] 

## 2024-07-28 ENCOUNTER — Encounter: Payer: Self-pay | Admitting: Internal Medicine

## 2024-07-28 LAB — ALLERGENS W/TOTAL IGE AREA 2
Alternaria Alternata IgE: 0.62 kU/L — AB
Aspergillus Fumigatus IgE: <0.1 kU/L
Bermuda Grass IgE: <0.1 kU/L
Cat Dander IgE: <0.1 kU/L
Cedar, Mountain IgE: <0.1 kU/L
Cladosporium Herbarum IgE: <0.1 kU/L
Cockroach, German IgE: <0.1 kU/L
Common Silver Birch IgE: <0.1 kU/L
Cottonwood IgE: <0.1 kU/L
D Farinae IgE: <0.1 kU/L
D Pteronyssinus IgE: <0.1 kU/L
Dog Dander IgE: <0.1 kU/L
Elm, American IgE: <0.1 kU/L
IgE (Immunoglobulin E), Serum: 50 [IU]/mL (ref 6–495)
Johnson Grass IgE: <0.1 kU/L
Maple/Box Elder IgE: <0.1 kU/L
Mouse Urine IgE: <0.1 kU/L
Oak, White IgE: <0.1 kU/L
Pecan, Hickory IgE: <0.1 kU/L
Penicillium Chrysogen IgE: <0.1 kU/L
Pigweed, Rough IgE: <0.1 kU/L
Ragweed, Short IgE: <0.1 kU/L
Sheep Sorrel IgE Qn: <0.1 kU/L
Timothy Grass IgE: <0.1 kU/L
White Mulberry IgE: <0.1 kU/L

## 2024-07-29 LAB — STREP PNEUMONIAE 23 SEROTYPES IGG
Pneumo Ab Type 1*: 0.6 ug/mL — AB (ref >1.3)
Pneumo Ab Type 12 (12F)*: 0.3 ug/mL — AB (ref >1.3)
Pneumo Ab Type 14*: 1.1 ug/mL — AB (ref >1.3)
Pneumo Ab Type 17 (17F)*: 2.8 ug/mL (ref >1.3)
Pneumo Ab Type 19 (19F)*: 2.7 ug/mL (ref >1.3)
Pneumo Ab Type 2*: <0.2 ug/mL — AB (ref >1.3)
Pneumo Ab Type 20*: 1 ug/mL — AB (ref >1.3)
Pneumo Ab Type 22 (22F)*: <0.1 ug/mL — AB (ref >1.3)
Pneumo Ab Type 23 (23F)*: 0.2 ug/mL — AB (ref >1.3)
Pneumo Ab Type 26 (6B)*: <0.1 ug/mL — AB (ref >1.3)
Pneumo Ab Type 3*: 0.1 ug/mL — AB (ref >1.3)
Pneumo Ab Type 34 (10A)*: 2.6 ug/mL (ref >1.3)
Pneumo Ab Type 4*: <0.1 ug/mL — AB (ref >1.3)
Pneumo Ab Type 43 (11A)*: 1.2 ug/mL — AB (ref >1.3)
Pneumo Ab Type 5*: <0.1 ug/mL — AB (ref >1.3)
Pneumo Ab Type 51 (7F)*: 1 ug/mL — AB (ref >1.3)
Pneumo Ab Type 54 (15B)*: 10.6 ug/mL (ref >1.3)
Pneumo Ab Type 56 (18C)*: 0.4 ug/mL — AB (ref >1.3)
Pneumo Ab Type 57 (19A)*: 3.6 ug/mL (ref >1.3)
Pneumo Ab Type 68 (9V)*: 0.1 ug/mL — AB (ref >1.3)
Pneumo Ab Type 70 (33F)*: 2.3 ug/mL (ref >1.3)
Pneumo Ab Type 8*: 4.5 ug/mL (ref >1.3)
Pneumo Ab Type 9 (9N)*: <0.1 ug/mL — AB (ref >1.3)

## 2024-07-29 LAB — CBC WITH DIFFERENTIAL/PLATELET
Basophils Absolute: 0 x10E3/uL (ref 0.0–0.2)
Basos: 0 %
EOS (ABSOLUTE): 0.1 x10E3/uL (ref 0.0–0.4)
Eos: 1 %
Hematocrit: 38.9 % (ref 34.0–46.6)
Hemoglobin: 12.3 g/dL (ref 11.1–15.9)
Immature Grans (Abs): 0 x10E3/uL (ref 0.0–0.1)
Immature Granulocytes: 0 %
Lymphocytes Absolute: 1.2 x10E3/uL (ref 0.7–3.1)
Lymphs: 16 %
MCH: 27.2 pg (ref 26.6–33.0)
MCHC: 31.6 g/dL (ref 31.5–35.7)
MCV: 86 fL (ref 79–97)
Monocytes Absolute: 0.4 x10E3/uL (ref 0.1–0.9)
Monocytes: 6 %
Neutrophils Absolute: 5.9 x10E3/uL (ref 1.4–7.0)
Neutrophils: 77 %
Platelets: 298 x10E3/uL (ref 150–450)
RBC: 4.53 x10E6/uL (ref 3.77–5.28)
RDW: 13.8 % (ref 11.7–15.4)
WBC: 7.7 x10E3/uL (ref 3.4–10.8)

## 2024-07-29 LAB — DIPHTHERIA / TETANUS ANTIBODY PANEL
Diphtheria Ab: 0.52 [IU]/mL (ref <0.10)
Tetanus Ab, IgG: 2.44 [IU]/mL (ref <0.10)

## 2024-07-29 LAB — IGG, IGA, IGM
IgG (Immunoglobin G), Serum: 861 mg/dL (ref 586–1602)
IgM (Immunoglobulin M), Srm: 90 mg/dL (ref 26–217)
Immunoglobulin A, (IgA) QN, Serum: 182 mg/dL (ref 87–352)

## 2024-08-01 ENCOUNTER — Ambulatory Visit: Payer: Self-pay | Admitting: Internal Medicine

## 2024-08-01 NOTE — Progress Notes (Signed)
 Please let Tiffany Weaver know that overall her immune system is reassuring except she does not have adequate coverage against strep pneumonia bacteria. I would recommend she get the Pneumovax 23 vaccine, and then we can repeat these titers. This has a 2-fold benefit: 1. If she responds, it gives her protection against this bacteria and 2. It let's me know her immune system is capable of making a response.   Her environmental panel is only positive to one outdoor mold, alternaria which we discussed in a previous message.  Let me know if she has any questions.

## 2024-08-03 ENCOUNTER — Other Ambulatory Visit: Payer: Self-pay | Admitting: *Deleted

## 2024-08-03 ENCOUNTER — Telehealth: Payer: Self-pay | Admitting: Internal Medicine

## 2024-08-03 DIAGNOSIS — J189 Pneumonia, unspecified organism: Secondary | ICD-10-CM

## 2024-08-03 NOTE — Telephone Encounter (Signed)
 Tiffany Weaver is scheduled for 11/18 at 9:00 am with Dr. Roark

## 2024-08-09 ENCOUNTER — Encounter (INDEPENDENT_AMBULATORY_CARE_PROVIDER_SITE_OTHER): Payer: Self-pay | Admitting: Otolaryngology

## 2024-08-09 ENCOUNTER — Ambulatory Visit (INDEPENDENT_AMBULATORY_CARE_PROVIDER_SITE_OTHER): Payer: Self-pay | Admitting: Otolaryngology

## 2024-08-09 VITALS — BP 130/81 | HR 91 | Temp 98.1°F | Ht 66.0 in | Wt 126.0 lb

## 2024-08-09 DIAGNOSIS — J358 Other chronic diseases of tonsils and adenoids: Secondary | ICD-10-CM

## 2024-08-09 DIAGNOSIS — K219 Gastro-esophageal reflux disease without esophagitis: Secondary | ICD-10-CM

## 2024-08-09 MED ORDER — PANTOPRAZOLE SODIUM 40 MG PO TBEC: 40.0000 mg | DELAYED_RELEASE_TABLET | Freq: Two times a day (BID) | ORAL | 1 refills | Status: AC

## 2024-08-09 NOTE — Progress Notes (Signed)
 Reason for Consult: Asymmetric tonsil hypertrophy Referring Physician: Dr. Marinda Kaska Tiffany Weaver is an 43 y.o. female.  HPI: She is here for evaluation of a asymmetry in her tonsils and also some throat mucus issues.  She had pneumonia and was treated with 2-3 rounds of antibiotics for that.  She has asthma.  She feels like she has resolved the pneumonia but has some erythema of the tonsil/throat at the same time this was all going on a month or so ago.  She now continues to have a mucus feeling in the throat, a tightness, throat clearing, pills seem to stick slightly.  Occasional change in her voice, and a cough that now is better since the pneumonia is resolved.  She drinks a significant amount of coffee including espresso.  She has no chronic nasal symptoms.  Currently she has an upper respiratory symptoms but that is acute.  She has no pain with swallowing or actual difficulty getting food or water down.  Past Medical History:  Diagnosis Date   Arthritis    Medical history non-contributory     Past Surgical History:  Procedure Laterality Date   CESAREAN SECTION     HYSTEROSCOPY WITH D & C N/A 12/06/2012   Procedure: DILATATION AND CURETTAGE / Removal of IUD-Mirena and insertion of Mirena IUD;  Surgeon: Toribio VEAR Ada, MD;  Location: [facility];  Service: Gynecology;  Laterality: N/A;  IUD removal    Family History  Problem Relation Age of Onset   Diabetes Mother    Asthma Son    Allergic rhinitis Daughter    Immunodeficiency Neg Hx     Social History:  reports that she has quit smoking. Her smoking use included cigarettes. She has a 7.5 pack-year smoking history. She has never been exposed to tobacco smoke. She has never used smokeless tobacco. She reports that she does not drink alcohol and does not use drugs.  Allergies:  Allergies  Allergen Reactions   Nitrofurantoin Monohyd Macro Hives    Medications: I have reviewed the patient's current medications.  No results found for  this or any previous visit (from the past 48 hours).  No results found.  ROS There were no vitals taken for this visit. Physical Exam Constitutional:      Appearance: Normal appearance.  HENT:     Head: Normocephalic and atraumatic.     Right Ear: Tympanic membrane is without lesions and middle ear aerated, ear canal and external ear normal.     Left Ear: Tympanic membrane is without lesions and middle ear aerated, ear canal and external ear normal.     Nose: Nose normal. Turbinates with mild hypertrophy, No significant swelling or masses.     Oral cavity/oropharynx: Mucous membranes are moist. No lesions or masses    Larynx: normal voice. Mirror attempted without success    Eyes:     Extraocular Movements: Extraocular movements intact.     Conjunctiva/sclera: Conjunctivae normal.     Pupils: Pupils are equal, round, and reactive to light.  Cardiovascular:     Rate and Rhythm: Normal rate.  Pulmonary:     Effort: Pulmonary effort is normal.  Musculoskeletal:     Cervical back: Normal range of motion and neck supple. No rigidity.  Lymphadenopathy:     Cervical: No cervical adenopathy or masses.salivary glands without lesions. .  Neurological:     Mental Status: He is alert. CN 2-12 intact. No nystagmus      Assessment/Plan: Tonsil asymmetry-she does have the  left side is larger than the right but the left side is outside the fold and easily visualized.  The right one is tucked into the fold.  Both tonsils look the same and actually look the same size when you bring the fold back on the right side.  There is no abnormality to the left tonsil.  It has the normal mucosa and normal architecture.  I told her we could check this again in about 3 months but I do not think this is anything pathologic.  Laryngal pharyngeal reflux-I think she has almost certain reflux.  All of her symptoms are consistent with reflux.  She drinks a significant amount of caffeine and coffee.  I would  recommend twice daily Protonix for a trial of a couple of months and see if she is improved.  We talked about decreasing the caffeine.  She will follow-up if she continues with symptoms.   Tiffany Weaver 08/09/2024, 8:51 AM

## 2024-08-31 ENCOUNTER — Ambulatory Visit: Payer: Self-pay | Admitting: Internal Medicine

## 2024-08-31 NOTE — Telephone Encounter (Signed)
 Have we been able to confirm the price for her for Pneumovax 23 if we get it for her in office? She does not have insurance. If this is not an option, she can get the Prevnar 20, but this is not giving me information that I would get with the Pneumovax. However, it is better than nothing since she is not protected and keeps getting sick.

## 2024-09-12 ENCOUNTER — Ambulatory Visit: Payer: Self-pay

## 2024-09-12 DIAGNOSIS — Z23 Encounter for immunization: Secondary | ICD-10-CM
